# Patient Record
Sex: Female | Born: 1980 | Race: White | Hispanic: No | Marital: Married | Smoking: Former smoker
Health system: Southern US, Community
[De-identification: ages and names within clinical notes are randomized; demographics above are authoritative.]

## PROBLEM LIST (undated history)

## (undated) DIAGNOSIS — F32A Depression, unspecified: Secondary | ICD-10-CM

## (undated) DIAGNOSIS — K219 Gastro-esophageal reflux disease without esophagitis: Secondary | ICD-10-CM

## (undated) DIAGNOSIS — F329 Major depressive disorder, single episode, unspecified: Secondary | ICD-10-CM

## (undated) DIAGNOSIS — F419 Anxiety disorder, unspecified: Secondary | ICD-10-CM

## (undated) DIAGNOSIS — Z8619 Personal history of other infectious and parasitic diseases: Secondary | ICD-10-CM

## (undated) HISTORY — DX: Depression, unspecified: F32.A

## (undated) HISTORY — DX: Gastro-esophageal reflux disease without esophagitis: K21.9

## (undated) HISTORY — DX: Anxiety disorder, unspecified: F41.9

## (undated) HISTORY — DX: Major depressive disorder, single episode, unspecified: F32.9

## (undated) HISTORY — DX: Personal history of other infectious and parasitic diseases: Z86.19

## (undated) HISTORY — PX: APPENDECTOMY: SHX54

---
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
## 2003-11-20 ENCOUNTER — Other Ambulatory Visit: Admission: RE | Admit: 2003-11-20 | Discharge: 2003-11-20 | Payer: Self-pay | Admitting: Obstetrics and Gynecology

## 2004-05-31 ENCOUNTER — Encounter (INDEPENDENT_AMBULATORY_CARE_PROVIDER_SITE_OTHER): Payer: Self-pay | Admitting: *Deleted

## 2004-05-31 ENCOUNTER — Observation Stay (HOSPITAL_COMMUNITY): Admission: EM | Admit: 2004-05-31 | Discharge: 2004-05-31 | Payer: Self-pay | Admitting: Emergency Medicine

## 2005-06-21 ENCOUNTER — Other Ambulatory Visit: Admission: RE | Admit: 2005-06-21 | Discharge: 2005-06-21 | Payer: Self-pay | Admitting: Obstetrics and Gynecology

## 2006-03-14 ENCOUNTER — Emergency Department (HOSPITAL_COMMUNITY): Admission: EM | Admit: 2006-03-14 | Discharge: 2006-03-14 | Payer: Self-pay | Admitting: Emergency Medicine

## 2006-04-02 ENCOUNTER — Other Ambulatory Visit: Admission: RE | Admit: 2006-04-02 | Discharge: 2006-04-02 | Payer: Self-pay | Admitting: Obstetrics and Gynecology

## 2007-04-13 ENCOUNTER — Emergency Department (HOSPITAL_COMMUNITY): Admission: EM | Admit: 2007-04-13 | Discharge: 2007-04-13 | Payer: Self-pay | Admitting: Emergency Medicine

## 2007-11-24 ENCOUNTER — Inpatient Hospital Stay (HOSPITAL_COMMUNITY): Admission: AD | Admit: 2007-11-24 | Discharge: 2007-11-24 | Payer: Self-pay | Admitting: Gynecology

## 2010-06-22 ENCOUNTER — Encounter: Admission: RE | Admit: 2010-06-22 | Discharge: 2010-06-22 | Payer: Self-pay | Admitting: Family Medicine

## 2010-12-02 ENCOUNTER — Other Ambulatory Visit: Payer: Self-pay | Admitting: Obstetrics and Gynecology

## 2010-12-02 DIAGNOSIS — R599 Enlarged lymph nodes, unspecified: Secondary | ICD-10-CM

## 2010-12-09 ENCOUNTER — Other Ambulatory Visit: Payer: Self-pay

## 2010-12-09 NOTE — Op Note (Signed)
[name], [name]              ACCOUNT NO.:  [id number]   MEDICAL RECORD NO.:  [id number]          PATIENT TYPE:  INP   LOCATION:  0111                         FACILITY:  [facility]   PHYSICIAN:  Leonie Man, M.D.   DATE OF BIRTH:  01-Apr-1981   DATE OF PROCEDURE:  05/31/2004  DATE OF DISCHARGE:                                 OPERATIVE REPORT   PREOPERATIVE DIAGNOSIS:  Acute appendicitis.   POSTOPERATIVE DIAGNOSIS:  Acute appendicitis.   PROCEDURE:  Laparoscopic appendectomy.   SURGEON:  Leonie Man, M.D.   ASSISTANT:  Nurse.   ANESTHESIA:  General.   NOTE:  The patient is a 30 year old woman who began having pain in the lower  abdomen on May 30, 2004.  This pain progressed and was associated with  nausea and vomiting, and in the ER she was noted to have a temperature of 99  and a white blood cell count of 16,000 with a left shift.  CT scan of the  abdomen was consistent with appendicitis.  She comes to the operating room  now after risks and potential benefits of surgery have been fully discussed  with her and with her mother, and they give consent to surgery.   The patient is allergic to PENICILLIN and was premedicated with Cleocin and  gentamicin.   PROCEDURE:  Following the induction of satisfactory general anesthesia with  the patient positioned supinely, the abdomen is prepped and draped to be  included in the sterile operative field.  Open laparoscopy is created at the  umbilicus with an insertion of Hasson cannula.  It is placed in the  peritoneal cavity to 14 mmHg pressure.  The camera was inserted.  Visual  exploration showed a suppurative appendicitis.  The remainder of the small  and large intestine and all other parts of the viscera viewed appeared to be  normal.  Under direct vision a left lower quadrant trocar site was placed,  also a suprapubic trocar site.  The appendix was grasped and the  mesoappendix divided and using an Endo-GIA, the appendix  was transected at  its base.  There was no bleeding.  The mesoappendix was transected with the  electrocautery and there was no bleeding.  All areas of dissection were then  checked for hemostasis and noted to be dry.  The right lower quadrant was  then thoroughly irrigated with multiple aliquots of normal saline and  aspirated.  The appendix was placed in an Endopouch and retrieved through  the umbilical wound.  The sponge and instrument counts were verified,  trocars removed, and the wound closed in layers as follows:  The umbilical  wound in two layers with 0 Vicryl and 4-0 Monocryl, the suprapubic and left  lower quadrant wounds  closed with 4-0 Monocryl sutures.  The wounds were then reinforced with  Dermabond, the anesthetic reversed, and the patient removed from the  operating room to the recovery room in stable condition.  She tolerated the  procedure well.     Patr   PB/MEDQ  D:  05/31/2004  T:  05/31/2004  Job:  [id number]

## 2010-12-09 NOTE — H&P (Signed)
Bianca Harrington, BERKE NO.:  [id number]   MEDICAL RECORD NO.:  [id number]          PATIENT TYPE:  EMS   LOCATION:  ED                           FACILITY:  [facility]   PHYSICIAN:  Leonie Man, M.D.   DATE OF BIRTH:  [birth year]   DATE OF ADMISSION:  05/30/2004  DATE OF DISCHARGE:                                HISTORY & PHYSICAL   PROBLEM:  Abdominal pain starting at 2:20 p.m. on May 30, 2004  associated with nausea and vomiting in this 30 year old female. She did eat  some cereal this past p.m. but because of her increasing lower abdominal  pain more on the right than on the left, she was sent to the ER and noted to  have a temperature of 99 with a white count of 16,000. Hemoglobin 14,  platelets 284,000.  She had a CT scan of the abdomen which was consistent  with acute appendicitis, also noted was what seemed to be follicular cyst of  the left ovary.   PAST MEDICAL HISTORY:  The patient has had no previous surgical operations.  She takes no medications chronically.   ALLERGIES:  PENICILLIN.   SOCIAL HISTORY:  This 30 year old works for [employer]  currently but  plans to return to school in January.  She was recently off birth control  patch for the past month. She did not have any menses in October.  Her urine  pregnancy test today is negative. The patient does not smoke or use alcohol.   REVIEW OF SYSTEMS:  Negative in detail except as outlined above.   PHYSICAL EXAMINATION:  GENERAL:  She is a well-developed, well-nourished  female.  VITAL SIGNS:  Her blood pressure is 116/72, pulse is 69 and regular,  respirations 16 and temperature is 99.2.  HEENT:  Sclerae are anicteric. Pupils are equal. No nasal obstruction.  Oropharynx is benign.  NECK:  No thyromegaly, no adenopathy.  LUNGS:  Clear to auscultation bilaterally.  HEART:  Regular rate and rhythm without murmurs.  ABDOMEN:  Tender in the right lower quadrant great than in the left lower  quadrant but she does have rebound in the right lower quadrant.  No masses,  no visceromegaly.  EXTREMITIES:  The patient moves all 4 extremities well. There is no calf  tenderness or ankle edema.   ASSESSMENT:  Acute appendicitis.   PLAN:  Appendectomy. Will probable be able to do this laparoscopically but  may need to do an open appendectomy.  I discussed the risks and potential  benefits of surgery with the patient and her mother who is present with her  in the room. The patient is somewhat sleepy and sedated although her last  dose of narcotics was more than 4 hours ago. They indicate that they  understand these risks and benefits and give consent to surgery.     Patr  PB/MEDQ  D:  05/31/2004  T:  05/31/2004  Job:  [id number]

## 2010-12-12 ENCOUNTER — Ambulatory Visit
Admission: RE | Admit: 2010-12-12 | Discharge: 2010-12-12 | Disposition: A | Discharge status: Home / Self Care | Payer: BC Managed Care – PPO | Source: Ambulatory Visit | Attending: Obstetrics and Gynecology | Admitting: Obstetrics and Gynecology

## 2010-12-12 DIAGNOSIS — R599 Enlarged lymph nodes, unspecified: Secondary | ICD-10-CM

## 2011-01-04 ENCOUNTER — Other Ambulatory Visit (HOSPITAL_COMMUNITY): Payer: Self-pay | Admitting: Obstetrics and Gynecology

## 2011-01-04 DIAGNOSIS — O3680X0 Pregnancy with inconclusive fetal viability, not applicable or unspecified: Secondary | ICD-10-CM

## 2011-01-05 ENCOUNTER — Ambulatory Visit (HOSPITAL_COMMUNITY): Payer: BC Managed Care – PPO

## 2011-01-05 ENCOUNTER — Ambulatory Visit (HOSPITAL_COMMUNITY)
Admission: RE | Admit: 2011-01-05 | Discharge: 2011-01-05 | Disposition: A | Discharge status: Home / Self Care | Payer: BC Managed Care – PPO | Source: Ambulatory Visit | Attending: Obstetrics and Gynecology | Admitting: Obstetrics and Gynecology

## 2011-01-05 DIAGNOSIS — O3680X0 Pregnancy with inconclusive fetal viability, not applicable or unspecified: Secondary | ICD-10-CM

## 2011-01-05 DIAGNOSIS — Z3689 Encounter for other specified antenatal screening: Secondary | ICD-10-CM | POA: Insufficient documentation

## 2011-01-05 DIAGNOSIS — O209 Hemorrhage in early pregnancy, unspecified: Secondary | ICD-10-CM | POA: Insufficient documentation

## 2011-01-07 ENCOUNTER — Inpatient Hospital Stay (HOSPITAL_COMMUNITY): Payer: BC Managed Care – PPO

## 2011-01-07 ENCOUNTER — Inpatient Hospital Stay (HOSPITAL_COMMUNITY)
Admission: AD | Admit: 2011-01-07 | Discharge: 2011-01-07 | Disposition: A | Discharge status: Home / Self Care | Payer: BC Managed Care – PPO | Source: Ambulatory Visit | Attending: Obstetrics and Gynecology | Admitting: Obstetrics and Gynecology

## 2011-01-07 DIAGNOSIS — O200 Threatened abortion: Secondary | ICD-10-CM | POA: Insufficient documentation

## 2011-01-07 LAB — HCG, QUANTITATIVE, PREGNANCY: hCG, Beta Chain, Quant, S: 3387 m[IU]/mL — ABNORMAL HIGH (ref <5)

## 2011-01-07 LAB — ABO/RH: ABO/RH(D): O POS

## 2011-08-08 ENCOUNTER — Ambulatory Visit (INDEPENDENT_AMBULATORY_CARE_PROVIDER_SITE_OTHER): Payer: BC Managed Care – PPO

## 2011-08-08 DIAGNOSIS — G4700 Insomnia, unspecified: Secondary | ICD-10-CM

## 2011-08-08 DIAGNOSIS — F988 Other specified behavioral and emotional disorders with onset usually occurring in childhood and adolescence: Secondary | ICD-10-CM

## 2011-08-08 DIAGNOSIS — R509 Fever, unspecified: Secondary | ICD-10-CM

## 2011-08-08 DIAGNOSIS — F411 Generalized anxiety disorder: Secondary | ICD-10-CM

## 2011-11-14 IMAGING — US US OB TRANSVAGINAL
1 series · 14 of 22 positions shown · non-contrast
Comparison: Ultrasound 01/05/2011.

CLINICAL DATA: Vaginal bleeding.

TRANSVAGINAL OB ULTRASOUND
TECHNIQUE: Transvaginal ultrasound was performed for evaluation of
the gestation as well as the maternal uterus and adnexal regions.

[Series 1: us ob comp less 14 wks · 22 acquisitions, 14 frames shown]
[im 1/22]
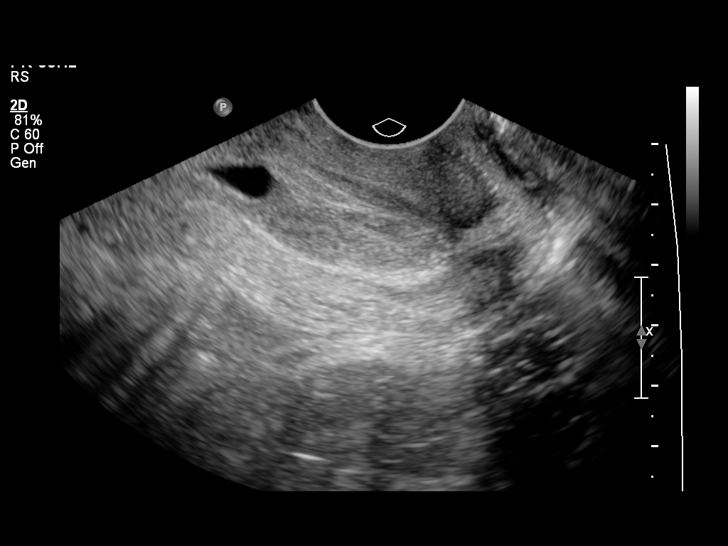
[im 3/22]
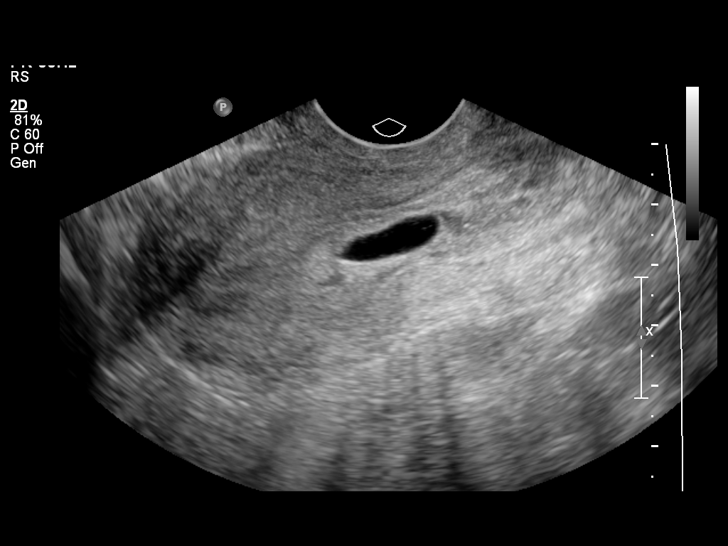
[im 4/22]
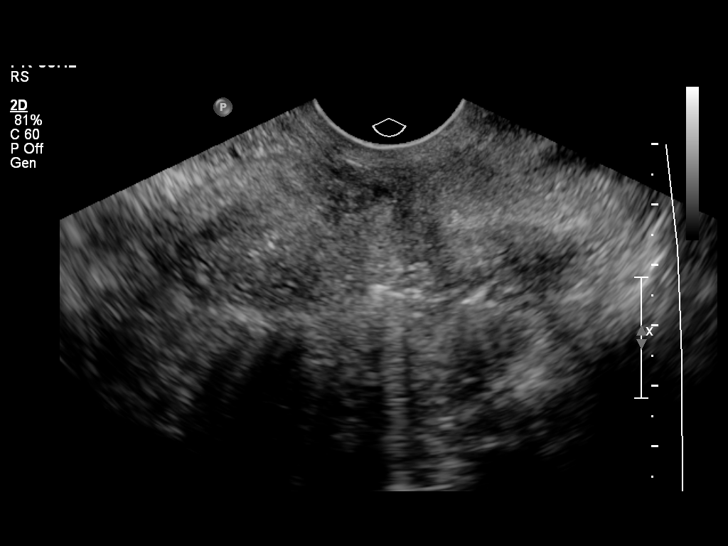
[im 6/22]
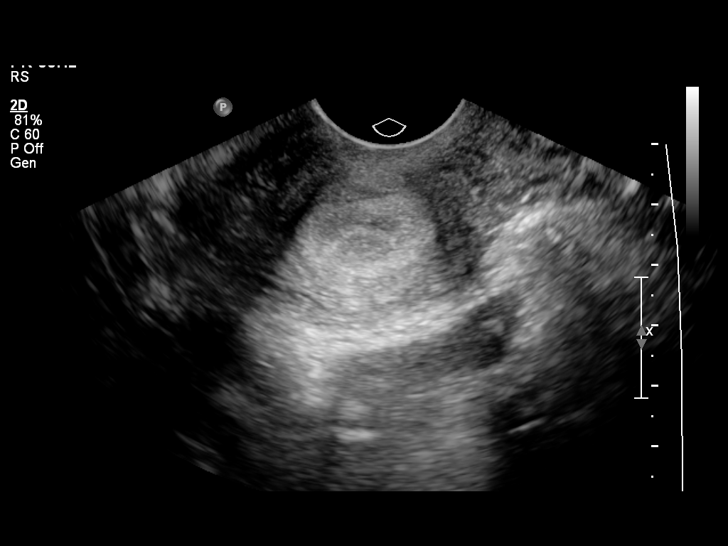
[im 8/22]
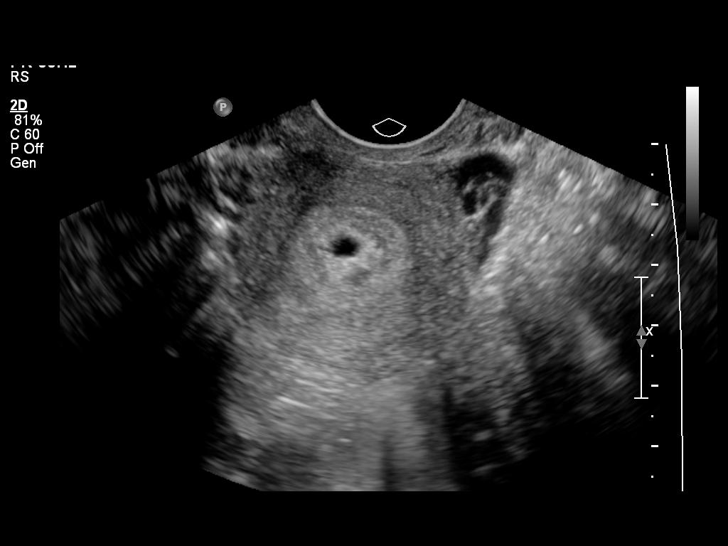
[im 9/22]
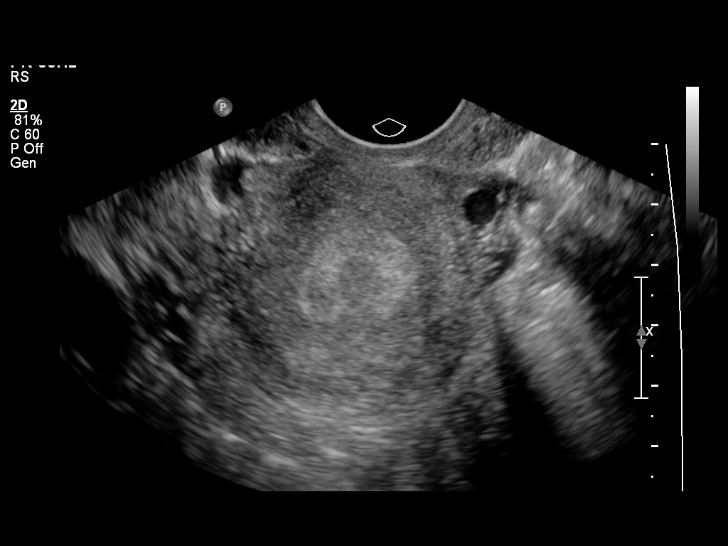
[im 11/22]
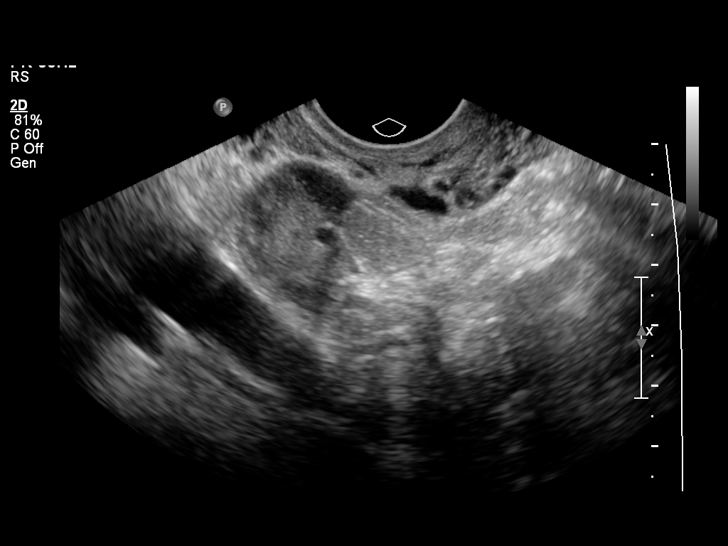
[im 12/22]
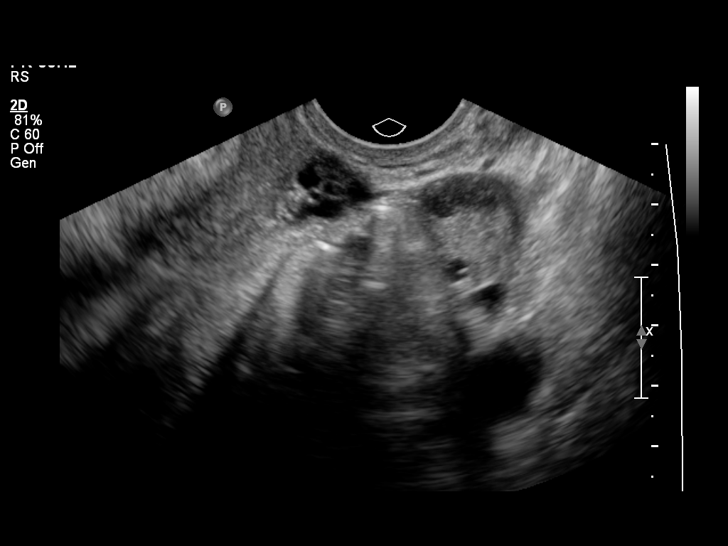
[im 14/22]
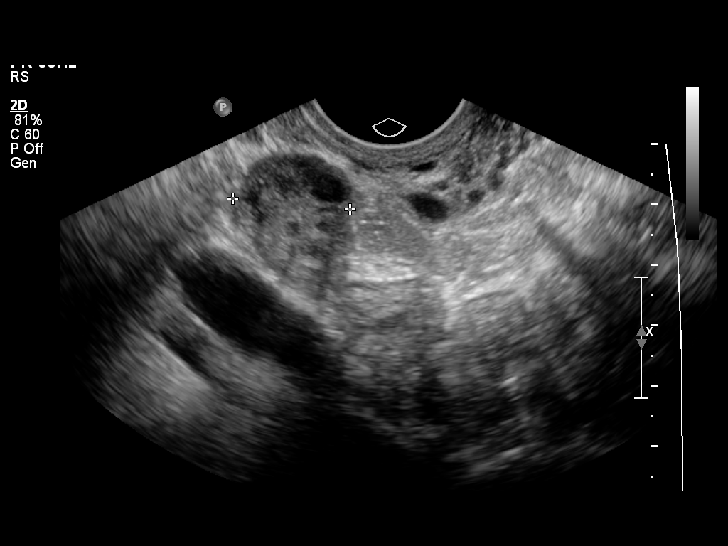
[im 15/22]
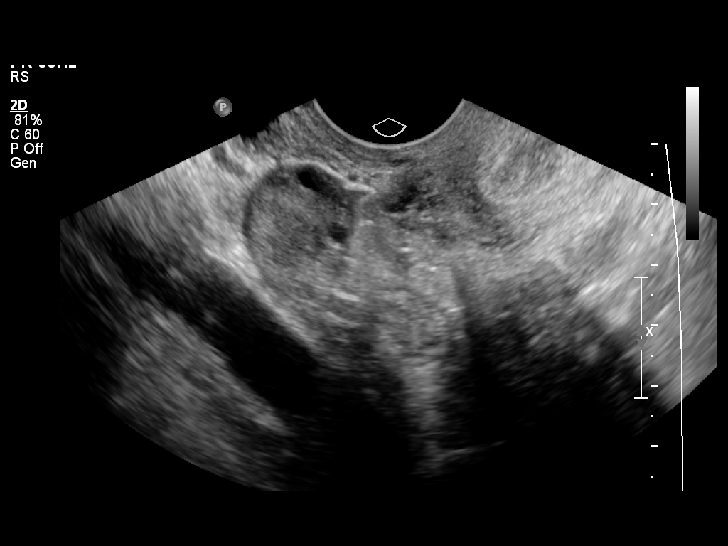
[im 17/22]
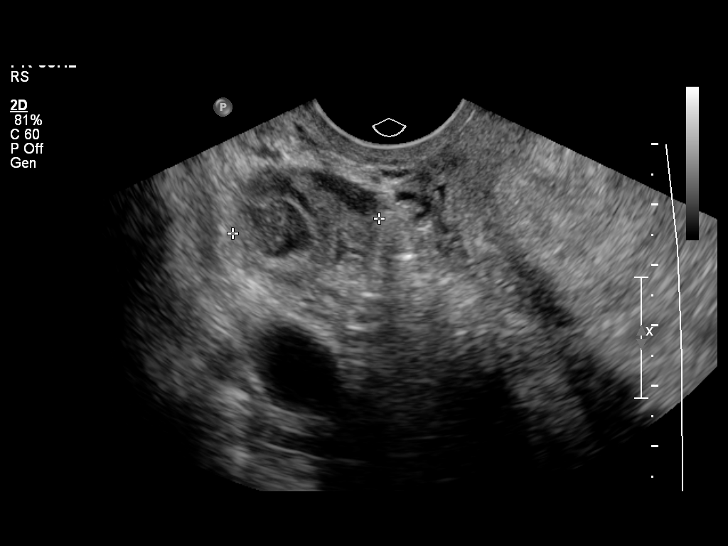
[im 19/22]
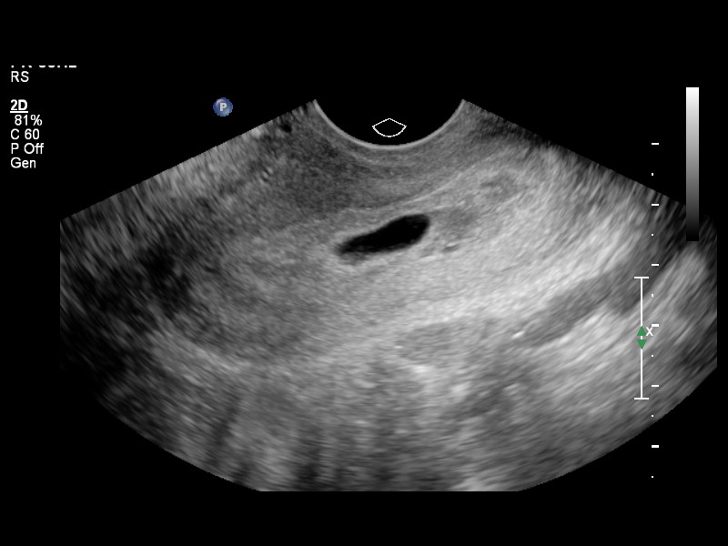
[im 20/22]
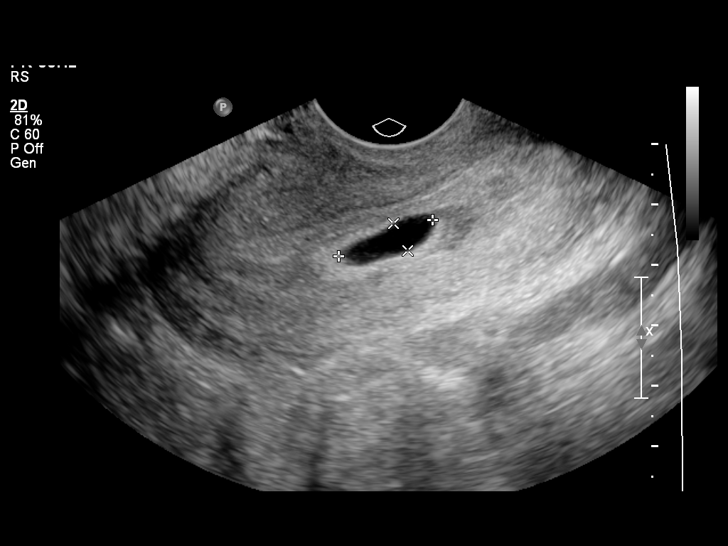
[im 22/22]
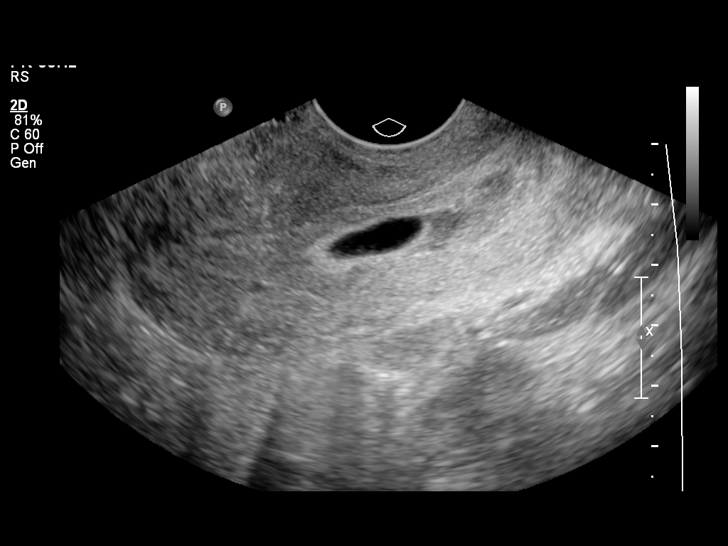

[14 of 22 positions shown; findings below may reference images not displayed]

FINDINGS: There is a single intrauterine gestational sac estimated
at 5 weeks and 5 days gestation.  Decidual reaction is noted.  The
gestational sac is somewhat elongated. The gestational sac is
located in the midbody lower uterine segment junction region.

No embryonic pole or yolk sac is identified.

No subchorionic hemorrhage.
Both ovaries appear normal.
IMPRESSION: 1.  Intrauterine gestational sac estimated 5 weeks and 5 days
gestation.  No yolk sac or embryonic pole. Recommend continued
clinical and sonographic follow-up.
2.  No subchorionic hemorrhage.
3.  Normal ovaries.

## 2011-11-29 ENCOUNTER — Ambulatory Visit: Payer: BC Managed Care – PPO

## 2011-11-29 ENCOUNTER — Ambulatory Visit (INDEPENDENT_AMBULATORY_CARE_PROVIDER_SITE_OTHER): Payer: BC Managed Care – PPO | Admitting: Internal Medicine

## 2011-11-29 VITALS — BP 110/64 | HR 79 | Temp 98.3°F | Resp 16 | Ht 66.75 in | Wt 123.4 lb

## 2011-11-29 DIAGNOSIS — F411 Generalized anxiety disorder: Secondary | ICD-10-CM

## 2011-11-29 DIAGNOSIS — R5381 Other malaise: Secondary | ICD-10-CM

## 2011-11-29 DIAGNOSIS — R531 Weakness: Secondary | ICD-10-CM

## 2011-11-29 DIAGNOSIS — J40 Bronchitis, not specified as acute or chronic: Secondary | ICD-10-CM

## 2011-11-29 DIAGNOSIS — R42 Dizziness and giddiness: Secondary | ICD-10-CM

## 2011-11-29 DIAGNOSIS — R55 Syncope and collapse: Secondary | ICD-10-CM

## 2011-11-29 LAB — POCT CBC
HCT, POC: 44.1 % (ref 37.7–47.9)
Hemoglobin: 14.1 g/dL (ref 12.2–16.2)
MPV: 9.2 fL (ref 0–99.8)
POC Granulocyte: 6.9 (ref 2–6.9)
POC MID %: 6.7 %M (ref 0–12)
RBC: 4.92 M/uL (ref 4.04–5.48)

## 2011-11-29 LAB — POCT URINE PREGNANCY: Preg Test, Ur: NEGATIVE

## 2011-11-29 MED ORDER — SULFAMETHOXAZOLE-TRIMETHOPRIM 800-160 MG PO TABS: 1.0000 | ORAL_TABLET | Freq: Two times a day (BID) | ORAL | Status: AC

## 2011-11-29 MED ORDER — SERTRALINE HCL 50 MG PO TABS: 50.0000 mg | ORAL_TABLET | Freq: Every day | ORAL | Status: DC

## 2011-11-29 NOTE — Patient Instructions (Signed)
Therapist mickey dew. zoloft 1 tab daily. Bactrim 1 tab 2 times dialy for 10 days. Return to follow up as directed.

## 2011-11-29 NOTE — Progress Notes (Signed)
Subjective:    Patient ID: Bianca Harrington, female    DOB: [birth year], 31 y.o.   MRN: [id number]  HPI Dizz,y weak, hot and cold,symptoms occur more after she eats. Missed lunch today. No weight loss No fevers Symptoms have been going on for 6 months.  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 Also picks at her skin  Review of Systems  Unable to perform ROS Constitutional: Positive for chills.  HENT: Negative.   Eyes: Negative.   Respiratory: Negative.   Cardiovascular: Negative.   Gastrointestinal: Negative.   Genitourinary: Negative.   Musculoskeletal: Negative.   Skin: Negative.        When she is anxious she picks on the skin on her back  Neurological: Negative.   Hematological: Negative.   Psychiatric/Behavioral: Positive for sleep disturbance and agitation. Negative for suicidal ideas, hallucinations, behavioral problems, confusion, self-injury, dysphoric mood and decreased concentration. The patient is nervous/anxious. The patient is not hyperactive.   All other systems reviewed and are negative.       Objective:   Physical Exam  Nursing note and vitals reviewed. Constitutional: She is oriented to person, place, and time. She appears well-developed and well-nourished.  HENT:  Head: Normocephalic and atraumatic.  Right Ear: External ear normal.  Left Ear: External ear normal.  Nose: Nose normal.  Eyes: Conjunctivae and EOM are normal. Pupils are equal, round, and reactive to light.  Neck: Normal range of motion. Neck supple.  Cardiovascular: Normal rate, regular rhythm, normal heart sounds and intact distal pulses.   Pulmonary/Chest: Effort normal and breath sounds normal.  Abdominal: Soft. Bowel sounds are normal.  Musculoskeletal: Normal range of motion.  Neurological: She is alert and oriented to person,  place, and time. She has normal reflexes.  Skin: Skin is warm and dry.       Picked acne on her back has several Band-Aid on it  Psychiatric: Her behavior is normal. Judgment and thought content normal.       Anxious no suicidal no homicidal Feels safe in her home environment    Results for orders placed in visit on 11/29/11  POCT URINE PREGNANCY      Component Value Range   Preg Test, Ur Negative    GLUCOSE, POCT (MANUAL RESULT ENTRY)      Component Value Range   POC Glucose 76    POCT CBC      Component Value Range   WBC 11.3 (*) 4.6 - 10.2 (K/uL)   Lymph, poc 3.6 (*) 0.6 - 3.4    POC LYMPH PERCENT 32.1  10 - 50 (%L)   MID (cbc) 0.8  0 - 0.9    POC MID % 6.7  0 - 12 (%M)   POC Granulocyte 6.9  2 - 6.9    Granulocyte percent 61.2  37 - 80 (%G)   RBC 4.92  4.04 - 5.48 (M/uL)   Hemoglobin 14.1  12.2 - 16.2 (g/dL)   HCT, POC 40.9  81.1 - 47.9 (%)   MCV 89.7  80 - 97 (fL)   MCH, POC 28.7  27 - 31.2 (pg)   MCHC 32.0  31.8 - 35.4 (g/dL)   RDW, POC 91.4     Platelet Count, POC 364  142 - 424 (K/uL)   MPV 9.2  0 - 99.8 (fL)   [facility] reading (  PRIMARY) by  Dr. Mindi Harrington .increased bronchial markings o nthe right consistent with bronchitus.     ekg nsr hr 55 no acute chnages Assessment & Plan:  Increased wbc and sob with increased markings on the right on chest xray. Will rx with antibiotics. Also recommend restarting her antidepressant does not want ot start celexa again will suggest zocloft. And re establish relationship with therapist.

## 2011-12-26 ENCOUNTER — Other Ambulatory Visit: Payer: Self-pay | Admitting: Dermatology

## 2012-01-30 ENCOUNTER — Other Ambulatory Visit: Payer: Self-pay | Admitting: Internal Medicine

## 2012-02-01 ENCOUNTER — Telehealth: Payer: Self-pay | Admitting: Internal Medicine

## 2012-02-02 ENCOUNTER — Other Ambulatory Visit: Payer: Self-pay | Admitting: *Deleted

## 2012-02-02 NOTE — Telephone Encounter (Signed)
i can't find request for rx-where is it?

## 2012-02-02 NOTE — Telephone Encounter (Signed)
I will help you when you get to work today.

## 2012-04-25 ENCOUNTER — Other Ambulatory Visit: Payer: Self-pay | Admitting: Internal Medicine

## 2012-04-25 ENCOUNTER — Other Ambulatory Visit: Payer: Self-pay | Admitting: Physician Assistant

## 2012-05-29 ENCOUNTER — Other Ambulatory Visit: Payer: Self-pay | Admitting: Physician Assistant

## 2012-05-30 ENCOUNTER — Other Ambulatory Visit: Payer: Self-pay | Admitting: Radiology

## 2012-09-02 ENCOUNTER — Other Ambulatory Visit: Payer: Self-pay | Admitting: Physician Assistant

## 2012-09-02 NOTE — Telephone Encounter (Signed)
Needs OV.  

## 2012-09-23 ENCOUNTER — Other Ambulatory Visit: Payer: Self-pay | Admitting: Physician Assistant

## 2012-10-03 ENCOUNTER — Other Ambulatory Visit: Payer: Self-pay | Admitting: Physician Assistant

## 2012-10-04 ENCOUNTER — Other Ambulatory Visit: Payer: Self-pay | Admitting: Radiology

## 2012-10-04 NOTE — Telephone Encounter (Signed)
Rx faxed to Pharmacy.

## 2012-10-21 ENCOUNTER — Ambulatory Visit: Payer: Self-pay | Admitting: Internal Medicine

## 2012-10-21 VITALS — BP 102/52 | HR 61 | Temp 98.3°F | Resp 16 | Ht 67.0 in | Wt 129.6 lb

## 2012-10-21 DIAGNOSIS — G4700 Insomnia, unspecified: Secondary | ICD-10-CM

## 2012-10-21 DIAGNOSIS — F411 Generalized anxiety disorder: Secondary | ICD-10-CM

## 2012-10-21 DIAGNOSIS — F429 Obsessive-compulsive disorder, unspecified: Secondary | ICD-10-CM

## 2012-10-21 MED ORDER — SERTRALINE HCL 100 MG PO TABS: 100.0000 mg | ORAL_TABLET | Freq: Every day | ORAL | Status: DC

## 2012-10-21 MED ORDER — ALPRAZOLAM 0.5 MG PO TABS: 0.5000 mg | ORAL_TABLET | Freq: Three times a day (TID) | ORAL | Status: DC | PRN

## 2012-10-21 NOTE — Progress Notes (Signed)
  Subjective:    Patient ID: Bianca Harrington, female    DOB: 12-Nov-1980, 32 y.o.   MRN: [id number]  HPI here for refill of medications for anxiety disorder with mild depression 30 1/30year-old twins Body image disorder with controlled bleeding and overexercising in high school and early college At last visit with Dr. 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 sertraline (ZOLOFT) 100 MG tablet    Sig: Take 1 tablet (100 mg total) by mouth daily.    Dispense:  30 tablet    Refill:  5  . ALPRAZolam (XANAX) 0.5 MG tablet    Sig: Take 1 tablet (0.5 mg total) by mouth 3 (three) times daily as needed for anxiety. Needs office visit for further refills.    Dispense:  90 tablet    Refill:  1   Appointment 1043 months

## 2012-10-23 NOTE — Progress Notes (Signed)
4/2 Left msg for pt to schedule June f-up with Dr. Merla Riches.

## 2012-10-25 NOTE — Progress Notes (Signed)
10/25/12 Sent pt reminder letter to schedule June f-up with Dr. Merla Riches.

## 2013-01-08 ENCOUNTER — Ambulatory Visit (INDEPENDENT_AMBULATORY_CARE_PROVIDER_SITE_OTHER): Payer: BC Managed Care – PPO | Admitting: Internal Medicine

## 2013-01-08 VITALS — BP 109/74 | HR 75 | Temp 99.3°F | Resp 16 | Ht 67.0 in | Wt 128.0 lb

## 2013-01-08 DIAGNOSIS — F429 Obsessive-compulsive disorder, unspecified: Secondary | ICD-10-CM

## 2013-01-08 MED ORDER — SERTRALINE HCL 100 MG PO TABS: 100.0000 mg | ORAL_TABLET | Freq: Every day | ORAL | Status: DC

## 2013-01-08 MED ORDER — ALPRAZOLAM 0.5 MG PO TABS: 0.5000 mg | ORAL_TABLET | Freq: Three times a day (TID) | ORAL | Status: DC | PRN

## 2013-01-09 NOTE — Progress Notes (Signed)
  Subjective:    Patient ID: Bianca Harrington, female    DOB: [birth year], 32 y.o.   MRN: [id number]  HPIhere for f/u Patient Active Problem List   Diagnosis Date Noted  . OCD (obsessive compulsive disorder) 10/21/2012  doing very well since last ov Sleeping well-espec w/ alpraz hs Enjoys the kids being out of school Currently w/ GYN ?cramping and chg in periods --    Review of Systems noncontr Ros neg for sxt in other areas besides pelvic    Objective:   Physical Exam BP 109/74  Pulse 75  Temp(Src) 99.3 F (37.4 C)  Resp 16  Ht 5\' 7"  (1.702 m)  Wt 128 lb (58.06 kg)  BMI 20.04 kg/m2 HEENT clear Lungs clear No cva tend to perc Skin clear      Assessment & Plan:  OCD (obsessive compulsive disorder)  Meds ordered this encounter  Medications  . sertraline (ZOLOFT) 100 MG tablet    Sig: Take 1 tablet (100 mg total) by mouth daily.    Dispense:  90 tablet    Refill:  3  . ALPRAZolam (XANAX) 0.5 MG tablet    Sig: Take 1 tablet (0.5 mg total) by mouth 3 (three) times daily as needed for anxiety. Needs office visit for further refills.    Dispense:  90 tablet    Refill:  1   F/u as needed If decides to have another will d/c alpraz and disc zoloft w/ OB

## 2013-04-09 ENCOUNTER — Encounter: Payer: Self-pay | Admitting: Internal Medicine

## 2013-04-09 ENCOUNTER — Ambulatory Visit (INDEPENDENT_AMBULATORY_CARE_PROVIDER_SITE_OTHER): Payer: BC Managed Care – PPO | Admitting: Internal Medicine

## 2013-04-09 VITALS — BP 98/60 | HR 73 | Temp 98.9°F | Resp 16 | Ht 66.5 in | Wt 129.5 lb

## 2013-04-09 DIAGNOSIS — F429 Obsessive-compulsive disorder, unspecified: Secondary | ICD-10-CM

## 2013-04-09 DIAGNOSIS — Z23 Encounter for immunization: Secondary | ICD-10-CM

## 2013-04-09 MED ORDER — ALPRAZOLAM 0.5 MG PO TABS: 0.5000 mg | ORAL_TABLET | Freq: Three times a day (TID) | ORAL | Status: DC | PRN

## 2013-04-09 MED ORDER — BUPROPION HCL ER (XL) 150 MG PO TB24: ORAL_TABLET | ORAL | Status: DC

## 2013-04-09 MED ORDER — SERTRALINE HCL 25 MG PO TABS: ORAL_TABLET | ORAL | Status: DC

## 2013-04-09 MED ORDER — BUPROPION HCL ER (XL) 300 MG PO TB24: 300.0000 mg | ORAL_TABLET | Freq: Every day | ORAL | Status: DC

## 2013-04-09 NOTE — Progress Notes (Addendum)
  Subjective:    Patient ID: Bianca Harrington, female    DOB: [birth year], 32 y.o.   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 OCD--- changed to Wellbutrin  Insomnia secondary to anxiety-alprazolam at bedtime very effective  Read OAD- ?organ/?cbt Meds ordered this encounter  Medications  . sertraline (ZOLOFT) 25 MG tablet    Sig: 2 a day for 10 days then one a day for 7-10 days  weaning off     Dispense:  90 tablet    Refill:  3  . buPROPion (WELLBUTRIN XL) 150 MG 24 hr tablet    Sig: One a day for 10 days and then 2 a day    Dispense:  50 tablet    Refill:  0  . buPROPion (WELLBUTRIN XL) 300 MG 24 hr tablet----to fill in one month     Sig: Take 1 tablet (300 mg total) by mouth daily.    Dispense:  30 tablet    Refill:  2   . ALPRAZolam (XANAX) 0.5 MG tablet    Sig: Take 1 tablet (0.5 mg total) by mouth 3 (three) times daily as needed for anxiety.    Dispense:  90 tablet    Refill:  1       she now only uses this at bedtime and is very effective    Recheck as needed

## 2013-04-09 NOTE — Patient Instructions (Addendum)
Overcoming anxiety for dummies

## 2013-05-19 ENCOUNTER — Ambulatory Visit: Payer: BC Managed Care – PPO | Admitting: Family Medicine

## 2013-05-19 VITALS — BP 110/70 | HR 78 | Temp 98.8°F | Resp 16 | Ht 67.0 in | Wt 126.0 lb

## 2013-05-19 DIAGNOSIS — F411 Generalized anxiety disorder: Secondary | ICD-10-CM

## 2013-05-19 DIAGNOSIS — R599 Enlarged lymph nodes, unspecified: Secondary | ICD-10-CM

## 2013-05-19 DIAGNOSIS — T50905A Adverse effect of unspecified drugs, medicaments and biological substances, initial encounter: Secondary | ICD-10-CM

## 2013-05-19 DIAGNOSIS — R591 Generalized enlarged lymph nodes: Secondary | ICD-10-CM

## 2013-05-19 DIAGNOSIS — J069 Acute upper respiratory infection, unspecified: Secondary | ICD-10-CM

## 2013-05-19 DIAGNOSIS — T887XXA Unspecified adverse effect of drug or medicament, initial encounter: Secondary | ICD-10-CM

## 2013-05-19 MED ORDER — SERTRALINE HCL 25 MG PO TABS: 12.5000 mg | ORAL_TABLET | Freq: Every day | ORAL | Status: DC

## 2013-05-19 MED ORDER — DOXYCYCLINE HYCLATE 100 MG PO TABS: 100.0000 mg | ORAL_TABLET | Freq: Two times a day (BID) | ORAL | Status: DC

## 2013-05-19 NOTE — Progress Notes (Signed)
  Subjective:    Patient ID: Gus Height, female    DOB: [birth year], 32 y.o.   MRN: [id number]  HPI    Review of Systems     Objective:   Physical Exam HEENT: unremarkable except for narrowed nasla passages Chest:  Clear Heart:  Reg Neck:  Barely palpable posterior right node, nonfixed Axilla:  Movable normal sized nodes on right supraclavicluar area:  No adenopathy Skin:  normal     Assessment & Plan:  Anxiety state, unspecified - Plan: sertraline (ZOLOFT) 25 MG tablet  URI, acute - Plan: doxycycline (VIBRA-TABS) 100 MG tablet  Lymphadenopathy - Plan: doxycycline (VIBRA-TABS) 100 MG tablet  Adverse reaction to drug, initial encounter  Signed, Elvina Sidle, MD

## 2013-07-02 ENCOUNTER — Encounter: Payer: Self-pay | Admitting: Internal Medicine

## 2013-07-02 ENCOUNTER — Ambulatory Visit: Payer: BC Managed Care – PPO | Admitting: Internal Medicine

## 2013-07-02 VITALS — BP 112/64 | HR 74 | Temp 99.6°F | Resp 16 | Ht 67.0 in | Wt 127.0 lb

## 2013-07-02 DIAGNOSIS — R591 Generalized enlarged lymph nodes: Secondary | ICD-10-CM

## 2013-07-02 DIAGNOSIS — G4700 Insomnia, unspecified: Secondary | ICD-10-CM

## 2013-07-02 DIAGNOSIS — J069 Acute upper respiratory infection, unspecified: Secondary | ICD-10-CM

## 2013-07-02 DIAGNOSIS — F429 Obsessive-compulsive disorder, unspecified: Secondary | ICD-10-CM

## 2013-07-02 NOTE — Progress Notes (Signed)
   Subjective:    Patient ID: Bianca Harrington, female    DOB: 1980-12-20, 32 y.o.   MRN: [id number]  HPI  Patient reports right lymph node has decreased in size. Noticed in the spring. Was treated with doxy 10/14. Also has right axillary lymph nodes that have been stable for 4 years. "Obsessed." Concerned with smoking history. Stopped smoking in October.  More alert during the day with Wellbutrin. Occasionally needs Xanax. Sleeping better at night. Continues to obsess with cleanliness, but other obsessive thoughts not bothering her.  No medication side effects. Had some side effects with abrupt stopping of zoloft. Restarted zoloft and weaned slowly with improvement of side effects. Does not want to be on medicine forever, yet is concerned about not being able to control obsessive thoughts. One of big concerns is weight. Also concerned about adjusting when her twins go to kindergarten.   Mother has moved back to [place].   Review of Systems     Objective:   Physical Exam  Nursing note and vitals reviewed. Constitutional: She is oriented to person, place, and time. She appears well-developed and well-nourished.  Neck: Normal range of motion. Neck supple.  0.5 cm mobile, node at edge of right trapezius.  Pulmonary/Chest: Effort normal.  Lymphadenopathy:    She has cervical adenopathy.  Neurological: She is alert and oriented to person, place, and time.  Psychiatric: She has a normal mood and affect. Her behavior is normal. Judgment and thought content normal.      Assessment & Plan:  OCD (obsessive compulsive disorder)  continue bupropion 300 mg po qd; xanax for sleep/anxiety PRN  Follow up in 6 months  Lymphadenopathy-minor-reactive  Continue to monitor for increased size or pain.     I have completed the patient encounter in its entirety as documented by FNP gessner, with editing by me where necessary. Bianca Harrington P. Merla Riches, M.D.

## 2013-07-05 ENCOUNTER — Other Ambulatory Visit: Payer: Self-pay | Admitting: *Deleted

## 2013-07-05 MED ORDER — BUPROPION HCL ER (XL) 300 MG PO TB24: 300.0000 mg | ORAL_TABLET | Freq: Every day | ORAL | Status: DC

## 2013-10-01 ENCOUNTER — Other Ambulatory Visit: Payer: Self-pay | Admitting: Internal Medicine

## 2013-10-03 NOTE — Telephone Encounter (Signed)
Faxed

## 2013-12-28 ENCOUNTER — Other Ambulatory Visit: Payer: Self-pay | Admitting: Internal Medicine

## 2013-12-31 ENCOUNTER — Ambulatory Visit: Payer: BC Managed Care – PPO | Admitting: Internal Medicine

## 2014-01-02 NOTE — Telephone Encounter (Addendum)
This only showed back in my in basket today. Called in.

## 2014-01-28 ENCOUNTER — Ambulatory Visit: Payer: BC Managed Care – PPO | Admitting: Internal Medicine

## 2015-06-21 ENCOUNTER — Encounter: Payer: Self-pay | Admitting: Internal Medicine

## 2016-05-23 DIAGNOSIS — Z23 Encounter for immunization: Secondary | ICD-10-CM | POA: Diagnosis not present

## 2016-05-23 DIAGNOSIS — Z1389 Encounter for screening for other disorder: Secondary | ICD-10-CM | POA: Diagnosis not present

## 2016-05-23 DIAGNOSIS — Z6820 Body mass index (BMI) 20.0-20.9, adult: Secondary | ICD-10-CM | POA: Diagnosis not present

## 2016-05-23 DIAGNOSIS — Z01419 Encounter for gynecological examination (general) (routine) without abnormal findings: Secondary | ICD-10-CM | POA: Diagnosis not present

## 2016-05-29 DIAGNOSIS — N644 Mastodynia: Secondary | ICD-10-CM | POA: Diagnosis not present

## 2016-10-11 DIAGNOSIS — Z6820 Body mass index (BMI) 20.0-20.9, adult: Secondary | ICD-10-CM | POA: Diagnosis not present

## 2016-10-11 DIAGNOSIS — F321 Major depressive disorder, single episode, moderate: Secondary | ICD-10-CM | POA: Diagnosis not present

## 2016-10-11 DIAGNOSIS — F428 Other obsessive-compulsive disorder: Secondary | ICD-10-CM | POA: Diagnosis not present

## 2016-10-11 DIAGNOSIS — M2550 Pain in unspecified joint: Secondary | ICD-10-CM | POA: Diagnosis not present

## 2017-05-15 DIAGNOSIS — R590 Localized enlarged lymph nodes: Secondary | ICD-10-CM | POA: Diagnosis not present

## 2017-05-15 DIAGNOSIS — Z6820 Body mass index (BMI) 20.0-20.9, adult: Secondary | ICD-10-CM | POA: Diagnosis not present

## 2017-05-15 DIAGNOSIS — Z1389 Encounter for screening for other disorder: Secondary | ICD-10-CM | POA: Diagnosis not present

## 2017-05-15 DIAGNOSIS — Z01419 Encounter for gynecological examination (general) (routine) without abnormal findings: Secondary | ICD-10-CM | POA: Diagnosis not present

## 2017-05-15 DIAGNOSIS — Z23 Encounter for immunization: Secondary | ICD-10-CM | POA: Diagnosis not present

## 2017-05-15 DIAGNOSIS — R102 Pelvic and perineal pain: Secondary | ICD-10-CM | POA: Diagnosis not present

## 2017-05-18 DIAGNOSIS — R9389 Abnormal findings on diagnostic imaging of other specified body structures: Secondary | ICD-10-CM | POA: Diagnosis not present

## 2017-05-18 DIAGNOSIS — Z6820 Body mass index (BMI) 20.0-20.9, adult: Secondary | ICD-10-CM | POA: Diagnosis not present

## 2017-05-18 DIAGNOSIS — R590 Localized enlarged lymph nodes: Secondary | ICD-10-CM | POA: Diagnosis not present

## 2017-09-05 DIAGNOSIS — Z30432 Encounter for removal of intrauterine contraceptive device: Secondary | ICD-10-CM | POA: Diagnosis not present

## 2017-09-19 DIAGNOSIS — M25562 Pain in left knee: Secondary | ICD-10-CM | POA: Diagnosis not present

## 2017-09-19 DIAGNOSIS — M25561 Pain in right knee: Secondary | ICD-10-CM | POA: Diagnosis not present

## 2017-09-19 DIAGNOSIS — S83241A Other tear of medial meniscus, current injury, right knee, initial encounter: Secondary | ICD-10-CM | POA: Diagnosis not present

## 2017-09-25 DIAGNOSIS — M25561 Pain in right knee: Secondary | ICD-10-CM | POA: Diagnosis not present

## 2017-10-03 DIAGNOSIS — M170 Bilateral primary osteoarthritis of knee: Secondary | ICD-10-CM | POA: Diagnosis not present

## 2017-10-03 DIAGNOSIS — M6751 Plica syndrome, right knee: Secondary | ICD-10-CM | POA: Diagnosis not present

## 2017-10-10 DIAGNOSIS — G8929 Other chronic pain: Secondary | ICD-10-CM | POA: Diagnosis not present

## 2017-10-10 DIAGNOSIS — M25561 Pain in right knee: Secondary | ICD-10-CM | POA: Diagnosis not present

## 2017-10-10 DIAGNOSIS — M6281 Muscle weakness (generalized): Secondary | ICD-10-CM | POA: Diagnosis not present

## 2017-10-10 DIAGNOSIS — M6751 Plica syndrome, right knee: Secondary | ICD-10-CM | POA: Diagnosis not present

## 2017-11-28 DIAGNOSIS — J019 Acute sinusitis, unspecified: Secondary | ICD-10-CM | POA: Diagnosis not present

## 2017-12-04 DIAGNOSIS — Z3041 Encounter for surveillance of contraceptive pills: Secondary | ICD-10-CM | POA: Diagnosis not present

## 2017-12-04 DIAGNOSIS — Z6820 Body mass index (BMI) 20.0-20.9, adult: Secondary | ICD-10-CM | POA: Diagnosis not present

## 2018-08-21 DIAGNOSIS — Z01419 Encounter for gynecological examination (general) (routine) without abnormal findings: Secondary | ICD-10-CM | POA: Diagnosis not present

## 2018-08-21 DIAGNOSIS — R590 Localized enlarged lymph nodes: Secondary | ICD-10-CM | POA: Diagnosis not present

## 2018-08-21 DIAGNOSIS — Z23 Encounter for immunization: Secondary | ICD-10-CM | POA: Diagnosis not present

## 2018-08-21 DIAGNOSIS — Z3041 Encounter for surveillance of contraceptive pills: Secondary | ICD-10-CM | POA: Diagnosis not present

## 2018-08-21 DIAGNOSIS — Z1331 Encounter for screening for depression: Secondary | ICD-10-CM | POA: Diagnosis not present

## 2018-08-21 DIAGNOSIS — Z6820 Body mass index (BMI) 20.0-20.9, adult: Secondary | ICD-10-CM | POA: Diagnosis not present

## 2018-08-28 DIAGNOSIS — Z1329 Encounter for screening for other suspected endocrine disorder: Secondary | ICD-10-CM | POA: Diagnosis not present

## 2018-08-28 DIAGNOSIS — Z131 Encounter for screening for diabetes mellitus: Secondary | ICD-10-CM | POA: Diagnosis not present

## 2018-08-28 DIAGNOSIS — Z1321 Encounter for screening for nutritional disorder: Secondary | ICD-10-CM | POA: Diagnosis not present

## 2018-08-28 DIAGNOSIS — Z13228 Encounter for screening for other metabolic disorders: Secondary | ICD-10-CM | POA: Diagnosis not present

## 2019-10-04 LAB — HM PAP SMEAR
HM Pap smear: NEGATIVE
HPV 16/18/45 genotyping: NEGATIVE

## 2019-10-15 ENCOUNTER — Ambulatory Visit: Payer: Self-pay | Attending: Internal Medicine

## 2021-04-11 ENCOUNTER — Other Ambulatory Visit (HOSPITAL_BASED_OUTPATIENT_CLINIC_OR_DEPARTMENT_OTHER): Payer: Self-pay | Admitting: Orthopaedic Surgery

## 2021-04-11 DIAGNOSIS — M25561 Pain in right knee: Secondary | ICD-10-CM

## 2021-04-11 DIAGNOSIS — M25562 Pain in left knee: Secondary | ICD-10-CM

## 2021-04-12 ENCOUNTER — Ambulatory Visit (HOSPITAL_BASED_OUTPATIENT_CLINIC_OR_DEPARTMENT_OTHER): Payer: 59 | Admitting: Orthopaedic Surgery

## 2021-04-12 ENCOUNTER — Ambulatory Visit (HOSPITAL_BASED_OUTPATIENT_CLINIC_OR_DEPARTMENT_OTHER)
Admission: RE | Admit: 2021-04-12 | Discharge: 2021-04-12 | Disposition: A | Discharge status: Home / Self Care | Payer: 59 | Source: Ambulatory Visit | Attending: Orthopaedic Surgery | Admitting: Orthopaedic Surgery

## 2021-04-12 ENCOUNTER — Other Ambulatory Visit: Payer: Self-pay

## 2021-04-12 DIAGNOSIS — M222X1 Patellofemoral disorders, right knee: Secondary | ICD-10-CM | POA: Diagnosis not present

## 2021-04-12 DIAGNOSIS — M25561 Pain in right knee: Secondary | ICD-10-CM | POA: Diagnosis present

## 2021-04-12 DIAGNOSIS — M25562 Pain in left knee: Secondary | ICD-10-CM | POA: Diagnosis not present

## 2021-04-12 DIAGNOSIS — M222X2 Patellofemoral disorders, left knee: Secondary | ICD-10-CM | POA: Diagnosis not present

## 2021-04-12 NOTE — Progress Notes (Signed)
Chief Complaint: bilateral knee pian     History of Present Illness:   Pain Score: 2/10 SANE: 80/100  Bianca Harrington is a 40 y.o. female with bilateral knee pain now going on for several years in fact since she says she was under the age use.  She has not been given an official diagnosis for her knees.  She does say that she has a history of being pigeon toed and bowlegged.  She did have an injection approximately 3 years ago in the right knee which gave temporary relief.  She states that the pain is worse with running longer distances or even walking.  She has 2 boys at home and dogs that she needs to take for a walk which causes significant pain in the knees.  She does say that there is audible crepitus in the knee which is associated with pain.  She has particularly difficult time going up and down stairs.  She has tried ibuprofen and Tylenol which only helped a little.  She has taken over-the-counter supplements such as fish oil which does not help.    Surgical History:   None  PMH/PSH/Family History/Social History/Meds/Allergies:    Past Medical History:  Diagnosis Date   Anxiety    Depression    Past Surgical History:  Procedure Laterality Date   APPENDECTOMY     Social History   Socioeconomic History   Marital status: Married    Spouse name: Not on file   Number of children: Not on file   Years of education: Not on file   Highest education level: Not on file  Occupational History   Not on file  Tobacco Use   Smoking status: Former   Smokeless tobacco: Not on file  Substance and Sexual Activity   Alcohol use: Yes   Drug use: No   Sexual activity: Yes    Birth control/protection: Pill  Other Topics Concern   Not on file  Social History Narrative   Not on file   Social Determinants of Health   Financial Resource Strain: Not on file  Food Insecurity: Not on file  Transportation Needs: Not on file  Physical Activity:  Not on file  Stress: Not on file  Social Connections: Not on file   Family History  Problem Relation Age of Onset   Cancer Maternal Grandmother    Cancer Paternal Grandmother    Cancer Paternal Grandfather    Allergies  Allergen Reactions   Penicillins Hives   Current Outpatient Medications  Medication Sig Dispense Refill   ALPRAZolam (XANAX) 0.5 MG tablet TAKE 1 TABLET BY MOUTH 3 TIMES DAILY AS NEEDED 90 tablet 1   buPROPion (WELLBUTRIN XL) 300 MG 24 hr tablet Take 1 tablet (300 mg total) by mouth daily. 30 tablet 5   doxycycline (VIBRA-TABS) 100 MG tablet Take 1 tablet (100 mg total) by mouth 2 (two) times daily. 20 tablet 0   NUVARING 0.12-0.015 MG/24HR vaginal ring      No current facility-administered medications for this visit.   No results found.  Review of Systems:   A ROS was performed including pertinent positives and negatives as documented in the HPI.  Physical Exam :   Constitutional: NAD and appears stated age Neurological: Alert and oriented Psych: Appropriate affect and cooperative There were no vitals taken for  this visit.   Comprehensive Musculoskeletal Exam:     Musculoskeletal Exam  Gait Normal  Alignment Normal   Right Left  Inspection Normal Normal  Palpation    Tenderness Patellofemoral Patellofemoral  Crepitus Patellofemoral Patellofemoral  Effusion None None  Range of Motion    Extension -3 -3  Flexion 130 130  Strength    Extension 5/5 5/5  Flexion 5/5 5/5  Ligament Exam     Generalized Laxity No No  Lachman Negative Negative   Pivot Shift Negative Negative  Anterior Drawer Negative Negative  Valgus at 0 Negative Negative  Valgus at 20 Negative Negative  Varus at 0 0 0  Varus at 20   0 0  Posterior Drawer at 90 0 0  Vascular/Lymphatic Exam    Edema None None  Venous Stasis Changes No No  Distal Circulation Normal Normal  Neurologic    Light Touch Sensation Intact Intact  Special Tests: Left worse than right weakness with  single-leg squat     Imaging:   Xray (right knee 4 views, left knee 4 views): Normal knee  I personally reviewed and interpreted the radiographs.   Assessment:   40 year old female with bilateral patellofemoral type pain and crepitus in the setting of hip and quad weakness.  We discussed the importance of building the strength of her proximal chain on the overall health of her knee.  As such I would like to refer to physical therapy for core hip and quad strengthening.  We discussed that ultimately if she does not improve in 4 to 6 weeks that an MRI may be warranted.  Plan :    -Physical therapy ordered for bilateral hip quad and knee strengthening in the setting of patellofemoral pain -Return to clinic in 6 weeks if no improvement with physical therapy    I personally saw and evaluated the patient, and participated in the management and treatment plan.  Huel Cote, MD Attending Physician, Orthopedic Surgery  This document was dictated using Dragon voice recognition software. A reasonable attempt at proof reading has been made to minimize errors.

## 2022-06-12 ENCOUNTER — Encounter: Payer: Self-pay | Admitting: Family Medicine

## 2022-06-12 ENCOUNTER — Ambulatory Visit (INDEPENDENT_AMBULATORY_CARE_PROVIDER_SITE_OTHER): Payer: BC Managed Care – PPO | Admitting: Family Medicine

## 2022-06-12 VITALS — BP 88/50 | HR 65 | Temp 98.5°F | Ht 66.5 in | Wt 129.5 lb

## 2022-06-12 DIAGNOSIS — Z0000 Encounter for general adult medical examination without abnormal findings: Secondary | ICD-10-CM | POA: Insufficient documentation

## 2022-06-12 DIAGNOSIS — K219 Gastro-esophageal reflux disease without esophagitis: Secondary | ICD-10-CM | POA: Diagnosis not present

## 2022-06-12 DIAGNOSIS — G4700 Insomnia, unspecified: Secondary | ICD-10-CM | POA: Insufficient documentation

## 2022-06-12 DIAGNOSIS — G4709 Other insomnia: Secondary | ICD-10-CM

## 2022-06-12 DIAGNOSIS — Z23 Encounter for immunization: Secondary | ICD-10-CM

## 2022-06-12 DIAGNOSIS — Z111 Encounter for screening for respiratory tuberculosis: Secondary | ICD-10-CM

## 2022-06-12 LAB — COMPREHENSIVE METABOLIC PANEL
ALT: 11 U/L (ref 0–35)
AST: 19 U/L (ref 0–37)
Albumin: 4.6 g/dL (ref 3.5–5.2)
Alkaline Phosphatase: 31 U/L — ABNORMAL LOW (ref 39–117)
BUN: 13 mg/dL (ref 6–23)
CO2: 26 mEq/L (ref 19–32)
Calcium: 9.6 mg/dL (ref 8.4–10.5)
Chloride: 103 mEq/L (ref 96–112)
Creatinine, Ser: 0.73 mg/dL (ref 0.40–1.20)
GFR: 103.69 mL/min (ref >60.00)
Glucose, Bld: 97 mg/dL (ref 70–99)
Potassium: 4.8 mEq/L (ref 3.5–5.1)
Sodium: 137 mEq/L (ref 135–145)
Total Bilirubin: 0.5 mg/dL (ref 0.2–1.2)
Total Protein: 7.3 g/dL (ref 6.0–8.3)

## 2022-06-12 LAB — TSH: TSH: 1.35 u[IU]/mL (ref 0.35–5.50)

## 2022-06-12 MED ORDER — TRAZODONE HCL 50 MG PO TABS: 25.0000 mg | ORAL_TABLET | Freq: Every evening | ORAL | 0 refills | Status: DC | PRN

## 2022-06-12 NOTE — Progress Notes (Unsigned)
Complete physical exam  Patient: Bianca Harrington   DOB: Jul 25, 1980   41 y.o. Female  MRN: [id number]  Subjective:    Chief Complaint  Patient presents with   Establish Care    Bianca Harrington is a 41 y.o. female who presents today for a complete physical exam. She reports consuming a general diet. Home exercise routine includes walking. She generally feels well. She reports sleeping well. She does have additional problems to discuss today.    Insomnia--pt is reporting a 13 year history of nighttime awakenings. States that it started after she had kids. States that she goes to bed around 9 pm and then randomly wakes up around 2-3 am, states that she starts thinking about things and then cannot get back to sleep. Is taking an OTC sleep aid which helps her fall asleep but then it does not help with staying asleep.  States that she has a history of anxiety but is not on any medication for this.  GERD-- States it has been years since she has had a pcp, usually just goes to urgent care. Patient is reporting an increase in heartburn. States that it has been getting worse progressively over the last year or so. States that she has changed her diet, symptoms are daily, pretty mch with every meal. Sometimes she feels bloated, but no significant changes in her stool, no changes in color, no abdominal pain, nausea or vomiting.   Most recent fall risk assessment:     No data to display           Most recent depression screenings:    06/12/2022    4:29 PM  PHQ 2/9 Scores  PHQ - 2 Score 0  PHQ- 9 Score 4    Dental: No current dental problems and Receives regular dental care  Patient Active Problem List   Diagnosis Date Noted   GERD (gastroesophageal reflux disease) 06/13/2022   Healthy adult on routine physical examination 06/12/2022   Insomnia 06/12/2022   OCD (obsessive compulsive disorder) 10/21/2012      Patient Care Team: Karie Georges, MD as PCP - General (Family  Medicine)   Outpatient Medications Prior to Visit  Medication Sig   Multiple Vitamins-Minerals (MULTIVITAMIN ADULTS PO) Take by mouth.   Norethindrone-Ethinyl Estradiol-Fe Biphas (LO LOESTRIN FE) 1 MG-10 MCG / 10 MCG tablet Take 1 tablet by mouth daily.   [DISCONTINUED] ALPRAZolam (XANAX) 0.5 MG tablet TAKE 1 TABLET BY MOUTH 3 TIMES DAILY AS NEEDED   [DISCONTINUED] buPROPion (WELLBUTRIN XL) 300 MG 24 hr tablet Take 1 tablet (300 mg total) by mouth daily.   [DISCONTINUED] doxycycline (VIBRA-TABS) 100 MG tablet Take 1 tablet (100 mg total) by mouth 2 (two) times daily.   [DISCONTINUED] NUVARING 0.12-0.015 MG/24HR vaginal ring    No facility-administered medications prior to visit.    Review of Systems  HENT:  Negative for hearing loss.   Eyes:  Negative for blurred vision.  Respiratory:  Negative for shortness of breath.   Cardiovascular:  Negative for chest pain.  Gastrointestinal: Negative.   Genitourinary: Negative.   Musculoskeletal:  Negative for back pain.  Neurological:  Negative for headaches.  Psychiatric/Behavioral:  Negative for depression.           Objective:     BP (!) 88/50 (BP Location: Left Arm, Patient Position: Sitting, Cuff Size: Normal)   Pulse 65   Temp 98.5 F (36.9 C) (Oral)   Ht 5' 6.5" (1.689 m)   Wt 129 lb 8  oz (58.7 kg)   SpO2 100%   BMI 20.59 kg/m    Physical Exam Vitals reviewed.  Constitutional:      Appearance: Normal appearance. She is well-groomed and normal weight.  HENT:     Right Ear: Tympanic membrane and ear canal normal.     Left Ear: Tympanic membrane and ear canal normal.     Mouth/Throat:     Mouth: Mucous membranes are moist.     Pharynx: Oropharynx is clear.  Eyes:     Conjunctiva/sclera: Conjunctivae normal.  Neck:     Thyroid: No thyromegaly.  Cardiovascular:     Rate and Rhythm: Normal rate and regular rhythm.     Pulses: Normal pulses.     Heart sounds: S1 normal and S2 normal.  Pulmonary:     Effort:  Pulmonary effort is normal.     Breath sounds: Normal breath sounds and air entry.  Abdominal:     General: Bowel sounds are normal.  Musculoskeletal:     Right lower leg: No edema.     Left lower leg: No edema.  Neurological:     Mental Status: She is alert and oriented to person, place, and time. Mental status is at baseline.     Gait: Gait is intact.  Psychiatric:        Mood and Affect: Mood and affect normal.        Speech: Speech normal.        Behavior: Behavior normal.        Judgment: Judgment normal.      Results for orders placed or performed in visit on 06/12/22  CMP  Result Value Ref Range   Sodium 137 135 - 145 mEq/L   Potassium 4.8 3.5 - 5.1 mEq/L   Chloride 103 96 - 112 mEq/L   CO2 26 19 - 32 mEq/L   Glucose, Bld 97 70 - 99 mg/dL   BUN 13 6 - 23 mg/dL   Creatinine, Ser 0.73 0.40 - 1.20 mg/dL   Total Bilirubin 0.5 0.2 - 1.2 mg/dL   Alkaline Phosphatase 31 (L) 39 - 117 U/L   AST 19 0 - 37 U/L   ALT 11 0 - 35 U/L   Total Protein 7.3 6.0 - 8.3 g/dL   Albumin 4.6 3.5 - 5.2 g/dL   GFR 103.69 >60.00 mL/min   Calcium 9.6 8.4 - 10.5 mg/dL  TSH  Result Value Ref Range   TSH 1.35 0.35 - 5.50 uIU/mL       Assessment & Plan:    Routine Health Maintenance and Physical Exam  Immunization History  Administered Date(s) Administered   Influenza,inj,Quad PF,6+ Mos 04/09/2013, 06/12/2022   PFIZER Comirnaty(Gray Top)Covid-19 Tri-Sucrose Vaccine 10/22/2020   Tdap 04/09/2013    Health Maintenance  Topic Date Due   PAP SMEAR-Modifier  Never done   COVID-19 Vaccine (2 - 2023-24 season) 03/24/2022   Hepatitis C Screening  06/13/2023 (Originally 02/26/2001)   HIV Screening  06/13/2023 (Originally 02/26/1998)   INFLUENZA VACCINE  Completed   HPV VACCINES  Aged Out    Discussed health benefits of physical activity, and encouraged her to engage in regular exercise appropriate for her age and condition.  Problem List Items Addressed This Visit       Unprioritized    Healthy adult on routine physical examination   Relevant Orders   CMP (Completed)   Insomnia    Chronic, ongoing, pt has been using OTC sleep aids but is still having nighttime awakenings.  I recommended trying trazodone 25- 50 mg at bedtime to see if this will help her stay asleep      Relevant Medications   traZODone (DESYREL) 50 MG tablet   Other Relevant Orders   TSH (Completed)   GERD (gastroesophageal reflux disease) - Primary (Chronic)    I advised she increase her treatment to famotidine 20 mg BID OTC. I will order a Urea breath test to rule out H. Pylori infection.       Relevant Orders   H. pylori breath test   Other Visit Diagnoses     Screening for tuberculosis       Relevant Orders   QuantiFERON-TB Gold Plus   Need for immunization against influenza       Relevant Orders   Flu Vaccine QUAD 6+ mos PF IM (Fluarix Quad PF) (Completed)      Return in about 1 year (around 06/13/2023).     Farrel Conners, MD

## 2022-06-12 NOTE — Patient Instructions (Signed)
Start pepcid (famotidine) 20 mg twice a day or 40 mg once a day.

## 2022-06-13 DIAGNOSIS — K219 Gastro-esophageal reflux disease without esophagitis: Secondary | ICD-10-CM | POA: Insufficient documentation

## 2022-06-13 NOTE — Assessment & Plan Note (Signed)
Chronic, ongoing, pt has been using OTC sleep aids but is still having nighttime awakenings. I recommended trying trazodone 25- 50 mg at bedtime to see if this will help her stay asleep

## 2022-06-13 NOTE — Assessment & Plan Note (Signed)
I advised she increase her treatment to famotidine 20 mg BID OTC. I will order a Urea breath test to rule out H. Pylori infection.

## 2022-06-18 LAB — QUANTIFERON-TB GOLD PLUS
Mitogen-NIL: >10 IU/mL
NIL: 0.07 IU/mL
QuantiFERON-TB Gold Plus: POSITIVE — AB
TB1-NIL: 0.23 IU/mL
TB2-NIL: 0.39 IU/mL

## 2022-06-19 ENCOUNTER — Encounter: Payer: Self-pay | Admitting: Family Medicine

## 2022-06-19 DIAGNOSIS — R7612 Nonspecific reaction to cell mediated immunity measurement of gamma interferon antigen response without active tuberculosis: Secondary | ICD-10-CM

## 2022-06-19 NOTE — Progress Notes (Signed)
I ordered a chest x-ray to be done at [facility]-- I already messaged pt over my chart.

## 2022-06-26 ENCOUNTER — Ambulatory Visit
Admission: RE | Admit: 2022-06-26 | Discharge: 2022-06-26 | Disposition: A | Discharge status: Home / Self Care | Payer: BC Managed Care – PPO | Source: Ambulatory Visit | Attending: Family Medicine | Admitting: Family Medicine

## 2022-06-26 DIAGNOSIS — R7612 Nonspecific reaction to cell mediated immunity measurement of gamma interferon antigen response without active tuberculosis: Secondary | ICD-10-CM

## 2022-06-26 DIAGNOSIS — R7611 Nonspecific reaction to tuberculin skin test without active tuberculosis: Secondary | ICD-10-CM | POA: Diagnosis not present

## 2022-06-28 ENCOUNTER — Other Ambulatory Visit: Payer: BC Managed Care – PPO

## 2022-06-28 DIAGNOSIS — R7612 Nonspecific reaction to cell mediated immunity measurement of gamma interferon antigen response without active tuberculosis: Secondary | ICD-10-CM

## 2022-06-30 LAB — QUANTIFERON-TB GOLD PLUS
Mitogen-NIL: >10 IU/mL
NIL: 0.7 IU/mL
QuantiFERON-TB Gold Plus: NEGATIVE
TB1-NIL: 0.03 IU/mL
TB2-NIL: <0 IU/mL

## 2022-07-03 NOTE — Progress Notes (Signed)
Repeat testing is negative

## 2022-09-25 ENCOUNTER — Encounter: Payer: Self-pay | Admitting: Family Medicine

## 2022-09-26 ENCOUNTER — Telehealth: Payer: BC Managed Care – PPO | Admitting: Family Medicine

## 2022-09-26 ENCOUNTER — Encounter: Payer: Self-pay | Admitting: Family Medicine

## 2022-09-26 VITALS — BP 104/78 | HR 70

## 2022-09-26 DIAGNOSIS — F321 Major depressive disorder, single episode, moderate: Secondary | ICD-10-CM | POA: Diagnosis not present

## 2022-09-26 MED ORDER — BUPROPION HCL ER (XL) 150 MG PO TB24: 150.0000 mg | ORAL_TABLET | Freq: Every day | ORAL | 1 refills | Status: DC

## 2022-09-26 NOTE — Assessment & Plan Note (Signed)
Characterized by anhedonia and lack of motivation. She is going to school now and this is adding to her usual stress level. We had a discussion about medication options and she wants to restart her wellbutrin 150 mg daily that she has been on in the past. I have sent an rx for this medication and I will see her in another video visit in 3-4 months to follow up.

## 2022-09-26 NOTE — Progress Notes (Signed)
Virtual Medical Office Visit  Patient:  Bianca Harrington      Age: 42 y.o.       Sex:  female  Date:   09/26/2022  PCP:    Farrel Conners, MD   [facility] Provider: Farrel Conners, MD    Assessment/Plan:   Summary assessment:  Bianca Harrington was seen today for depression.  Depression, major, single episode, moderate (HCC) Assessment & Plan: Characterized by anhedonia and lack of motivation. She is going to school now and this is adding to her usual stress level. We had a discussion about medication options and she wants to restart her wellbutrin 150 mg daily that she has been on in the past. I have sent an rx for this medication and I will see her in another video visit in 3-4 months to follow up.   Orders: -     buPROPion HCl ER (XL); Take 1 tablet (150 mg total) by mouth daily.  Dispense: 90 tablet; Refill: 1     Return in about 4 months (around 01/26/2023) for video visit to follow up on depression symptoms.   She was advised to call the office or go to ER if her condition worsens    Subjective:   Bianca Harrington is a 42 y.o. female with PMH significant for: Past Medical History:  Diagnosis Date   Anxiety    Depression    GERD (gastroesophageal reflux disease)    History of chickenpox      Presenting today with: Chief Complaint  Patient presents with   Depression    Patient complains of increased depression x1 month, initially suspected stress due to starting school recently     She clarifies and reports that her condition: Pt was trialed on trazodone for sleep, states that it wasn't really helping. States that her sleep pattern has been "ok", taking [employer] brand OTC sleep aid which seems to help some of the time.  Pt reports her GERD is under control, made to changes to her diet and this seemed to help with her symptoms. Was unable to get the urea breath test due to having an episode of covid, states that since her symptoms improved she  decided. Pt is reporting an increase in depressive symptoms. States that there are no exacerbating factors, but thinks that it is just the life stressors that are bothering her. States that she went back to school and is feeling overwhelming. No real crying spells, no SI, more of having difficulty with disinterest and motivation. States that she was on wellbutrin before and this seemed to be helping. Has been on celexa in the past, also has had xanax PRN in the past.           Objective/Observations  Physical Exam:  Polite and friendly Gen: NAD, resting comfortably Pulm: Normal work of breathing Neuro: Grossly normal, moves all extremities Psych: Normal affect and thought content   No images are attached to the encounter or orders placed in the encounter.    Results: No results found for any visits on 09/26/22.   No results found for this or any previous visit (from the past 2160 hour(s)).         Virtual Visit via Video   I connected with Bianca Harrington on 09/26/22 at  4:30 PM EST by a video enabled telemedicine application and verified that I am speaking with the correct person using two identifiers. The limitations of evaluation and management by telemedicine and the availability of in  person appointments were discussed. The patient expressed understanding and agreed to proceed.   Percentage of appointment time on video:  100% Patient location: Home Provider location: [facility] Persons participating in the virtual visit: Myself and Patient

## 2022-12-01 DIAGNOSIS — Z01419 Encounter for gynecological examination (general) (routine) without abnormal findings: Secondary | ICD-10-CM | POA: Diagnosis not present

## 2022-12-01 DIAGNOSIS — Z1231 Encounter for screening mammogram for malignant neoplasm of breast: Secondary | ICD-10-CM | POA: Diagnosis not present

## 2022-12-01 DIAGNOSIS — Z130 Encounter for screening for diseases of the blood and blood-forming organs and certain disorders involving the immune mechanism: Secondary | ICD-10-CM | POA: Diagnosis not present

## 2022-12-01 LAB — HM MAMMOGRAPHY

## 2022-12-05 ENCOUNTER — Other Ambulatory Visit: Payer: Self-pay | Admitting: Obstetrics and Gynecology

## 2022-12-05 DIAGNOSIS — R928 Other abnormal and inconclusive findings on diagnostic imaging of breast: Secondary | ICD-10-CM

## 2022-12-15 ENCOUNTER — Ambulatory Visit
Admission: RE | Admit: 2022-12-15 | Discharge: 2022-12-15 | Disposition: A | Discharge status: Home / Self Care | Payer: BC Managed Care – PPO | Source: Ambulatory Visit | Attending: Obstetrics and Gynecology | Admitting: Obstetrics and Gynecology

## 2022-12-15 ENCOUNTER — Other Ambulatory Visit: Payer: Self-pay | Admitting: Obstetrics and Gynecology

## 2022-12-15 DIAGNOSIS — N6001 Solitary cyst of right breast: Secondary | ICD-10-CM | POA: Diagnosis not present

## 2022-12-15 DIAGNOSIS — N6489 Other specified disorders of breast: Secondary | ICD-10-CM | POA: Diagnosis not present

## 2022-12-15 DIAGNOSIS — R92341 Mammographic extreme density, right breast: Secondary | ICD-10-CM | POA: Diagnosis not present

## 2022-12-15 DIAGNOSIS — R928 Other abnormal and inconclusive findings on diagnostic imaging of breast: Secondary | ICD-10-CM

## 2022-12-15 DIAGNOSIS — R922 Inconclusive mammogram: Secondary | ICD-10-CM | POA: Diagnosis not present

## 2022-12-15 DIAGNOSIS — R921 Mammographic calcification found on diagnostic imaging of breast: Secondary | ICD-10-CM | POA: Diagnosis not present

## 2022-12-19 ENCOUNTER — Other Ambulatory Visit: Payer: Self-pay | Admitting: Obstetrics and Gynecology

## 2022-12-19 DIAGNOSIS — R921 Mammographic calcification found on diagnostic imaging of breast: Secondary | ICD-10-CM

## 2023-06-06 ENCOUNTER — Telehealth: Payer: Self-pay | Admitting: Family Medicine

## 2023-06-06 NOTE — Telephone Encounter (Signed)
Requesting labs to prove hep B, MMR, varicella and TB

## 2023-06-07 ENCOUNTER — Ambulatory Visit (INDEPENDENT_AMBULATORY_CARE_PROVIDER_SITE_OTHER): Payer: BC Managed Care – PPO | Admitting: Family Medicine

## 2023-06-07 ENCOUNTER — Encounter: Payer: Self-pay | Admitting: Family Medicine

## 2023-06-07 VITALS — BP 110/78 | HR 81 | Temp 98.6°F | Ht 66.5 in | Wt 136.5 lb

## 2023-06-07 DIAGNOSIS — Z111 Encounter for screening for respiratory tuberculosis: Secondary | ICD-10-CM | POA: Diagnosis not present

## 2023-06-07 DIAGNOSIS — Z789 Other specified health status: Secondary | ICD-10-CM

## 2023-06-07 DIAGNOSIS — F321 Major depressive disorder, single episode, moderate: Secondary | ICD-10-CM | POA: Diagnosis not present

## 2023-06-07 DIAGNOSIS — Z0184 Encounter for antibody response examination: Secondary | ICD-10-CM | POA: Diagnosis not present

## 2023-06-07 MED ORDER — BUPROPION HCL ER (XL) 150 MG PO TB24: 150.0000 mg | ORAL_TABLET | Freq: Every day | ORAL | 1 refills | Status: AC

## 2023-06-07 NOTE — Progress Notes (Signed)
Established Patient Office Visit  Subjective   Patient ID: Bianca Harrington, female    DOB: Nov 09, 1980  Age: 42 y.o. MRN: [id number]  Chief Complaint  Patient presents with   Results    Patient requests lab testing required by nursing program    Patient is here for follow up.   Depression -- pt is doing well on the wellbutrin 150 mg daily. 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 She needs refills today on her wellbutrin. She denies any side effects to the medication, no new symptoms or issues to discuss.     Current Outpatient Medications  Medication Instructions   buPROPion (WELLBUTRIN XL) 150 mg, Oral, Daily   Multiple Vitamins-Minerals (MULTIVITAMIN ADULTS PO) Oral   Norethindrone-Ethinyl Estradiol-Fe Biphas (LO LOESTRIN FE) 1 MG-10 MCG / 10 MCG tablet 1 tablet, Oral, Daily    Patient Active Problem List   Diagnosis Date Noted   Depression, major, single episode, moderate (HCC) 09/26/2022   GERD (gastroesophageal reflux disease) 06/13/2022   Healthy adult on routine physical examination 06/12/2022   Insomnia 06/12/2022   OCD (obsessive compulsive disorder) 10/21/2012      Review of Systems  All other systems reviewed and are negative.     Objective:     BP 110/78 (BP Location: Left Arm, Patient Position: Sitting, Cuff Size: Normal)   Pulse 81   Temp 98.6 F (37 C) (Oral)   Ht 5' 6.5" (1.689 m)   Wt 136 lb 8 oz (61.9 kg)   SpO2 100%   BMI 21.70 kg/m     Physical Exam Vitals reviewed.  Constitutional:      Appearance: Normal appearance. She is well-groomed and normal weight.  Cardiovascular:     Rate and Rhythm: Normal rate and regular rhythm.     Pulses: Normal pulses.     Heart sounds: S1 normal and S2 normal.  Pulmonary:     Effort: Pulmonary effort is normal.     Breath sounds: Normal breath sounds and air entry.  Neurological:     Mental Status: She is alert and oriented to  person, place, and time. Mental status is at baseline.     Gait: Gait is intact.  Psychiatric:        Mood and Affect: Mood and affect normal.        Speech: Speech normal.        Behavior: Behavior normal.      The ASCVD Risk score (Arnett DK, et al., 2019) failed to calculate for the following reasons:   Cannot find a previous HDL lab   Cannot find a previous total cholesterol lab    Assessment & Plan:  Unknown varicella vaccination status -     Varicella zoster antibody, IgG  Immunity to measles, mumps, and rubella determined by serologic test -     Measles/Mumps/Rubella Immunity  Hepatitis B vaccination status unknown -     Hepatitis B surface antibody,qualitative  Tuberculosis screening -     QuantiFERON-TB Gold Plus  Depression, major, single episode, moderate (HCC) -     buPROPion HCl ER (XL); Take 1 tablet (150 mg total) by mouth daily.  Dispense: 90 tablet; Refill: 1   Symptoms are well controlled on the wellbutrin, will refill this for patient and continue as prescribed. RTC in 6 months for annual physical.   Return in about 6 months (around 12/05/2023) for annual physical exam.    Karie Georges,  MD

## 2023-06-09 LAB — VARICELLA ZOSTER ANTIBODY, IGG: Varicella IgG: 7.3 {s_co_ratio}

## 2023-06-09 LAB — MEASLES/MUMPS/RUBELLA IMMUNITY
Mumps IgG: 18.1 [AU]/ml
Rubella: 4.21 {index}
Rubeola IgG: <13.5 [AU]/ml — ABNORMAL LOW

## 2023-06-09 LAB — QUANTIFERON-TB GOLD PLUS
Mitogen-NIL: 7.43 [IU]/mL
NIL: 0.02 [IU]/mL
QuantiFERON-TB Gold Plus: NEGATIVE
TB1-NIL: 0.01 [IU]/mL
TB2-NIL: 0.01 [IU]/mL

## 2023-06-09 LAB — HEPATITIS B SURFACE ANTIBODY,QUALITATIVE: Hep B S Ab: REACTIVE — AB

## 2023-06-18 ENCOUNTER — Other Ambulatory Visit: Payer: Self-pay | Admitting: Obstetrics and Gynecology

## 2023-06-18 ENCOUNTER — Ambulatory Visit
Admission: RE | Admit: 2023-06-18 | Discharge: 2023-06-18 | Disposition: A | Discharge status: Home / Self Care | Payer: BC Managed Care – PPO | Source: Ambulatory Visit | Attending: Obstetrics and Gynecology | Admitting: Obstetrics and Gynecology

## 2023-06-18 DIAGNOSIS — R921 Mammographic calcification found on diagnostic imaging of breast: Secondary | ICD-10-CM | POA: Diagnosis not present

## 2023-06-26 ENCOUNTER — Ambulatory Visit: Payer: BC Managed Care – PPO | Admitting: Family Medicine

## 2023-06-26 ENCOUNTER — Encounter: Payer: Self-pay | Admitting: Family Medicine

## 2023-06-26 VITALS — BP 102/64 | HR 74 | Temp 98.4°F | Ht 67.25 in | Wt 136.8 lb

## 2023-06-26 DIAGNOSIS — Z0000 Encounter for general adult medical examination without abnormal findings: Secondary | ICD-10-CM

## 2023-06-26 DIAGNOSIS — Z1159 Encounter for screening for other viral diseases: Secondary | ICD-10-CM

## 2023-06-26 DIAGNOSIS — Z13220 Encounter for screening for lipoid disorders: Secondary | ICD-10-CM | POA: Diagnosis not present

## 2023-06-26 NOTE — Patient Instructions (Signed)

## 2023-06-26 NOTE — Progress Notes (Signed)
Complete physical exam  Patient: Bianca Harrington   DOB: 1981/05/10   42 y.o. Female  MRN: [id number]  Subjective:    Chief Complaint  Patient presents with   Annual Exam    Bianca Harrington is a 42 y.o. female who presents today for a complete physical exam. She reports consuming a general diet. Home exercise routine includes walks, runs, rides peleton bike 3-4 times per week. She generally feels well. She reports sleeping fairly well. She does not have additional problems to discuss today.    Most recent fall risk assessment:     No data to display           Most recent depression screenings:    06/26/2023    3:57 PM 06/07/2023    3:01 PM  PHQ 2/9 Scores  PHQ - 2 Score 0 0  PHQ- 9 Score 5 2    Vision:Within last year and Dental: No current dental problems and Receives regular dental care  Patient Active Problem List   Diagnosis Date Noted   Depression, major, single episode, moderate (HCC) 09/26/2022   GERD (gastroesophageal reflux disease) 06/13/2022   Healthy adult on routine physical examination 06/12/2022   Insomnia 06/12/2022   OCD (obsessive compulsive disorder) 10/21/2012      Patient Care Team: Karie Georges, MD as PCP - General (Family Medicine)   Outpatient Medications Prior to Visit  Medication Sig   buPROPion (WELLBUTRIN XL) 150 MG 24 hr tablet Take 1 tablet (150 mg total) by mouth daily.   Multiple Vitamins-Minerals (MULTIVITAMIN ADULTS PO) Take by mouth.   Norethindrone-Ethinyl Estradiol-Fe Biphas (LO LOESTRIN FE) 1 MG-10 MCG / 10 MCG tablet Take 1 tablet by mouth daily.   No facility-administered medications prior to visit.    Review of Systems  HENT:  Negative for hearing loss.   Eyes:  Negative for blurred vision.  Respiratory:  Negative for shortness of breath.   Cardiovascular:  Negative for chest pain.  Gastrointestinal: Negative.   Genitourinary: Negative.   Musculoskeletal:  Negative for back pain.  Neurological:   Negative for headaches.  Psychiatric/Behavioral:  Negative for depression.    Hearing Screening   500Hz  1000Hz  2000Hz  4000Hz   Right ear Pass Pass Pass Pass  Left ear Pass Pass Pass Pass   Vision Screening   Right eye Left eye Both eyes  Without correction 20/32 20/50   With correction           Objective:     BP 102/64 (BP Location: Left Arm, Patient Position: Sitting, Cuff Size: Normal)   Pulse 74   Temp 98.4 F (36.9 C) (Oral)   Ht 5' 7.25" (1.708 m)   Wt 136 lb 12.8 oz (62.1 kg)   SpO2 99%   BMI 21.27 kg/m    Physical Exam Vitals reviewed.  Constitutional:      Appearance: Normal appearance. She is well-groomed and normal weight.  HENT:     Right Ear: Tympanic membrane and ear canal normal.     Left Ear: Tympanic membrane and ear canal normal.     Mouth/Throat:     Mouth: Mucous membranes are moist.     Pharynx: No posterior oropharyngeal erythema.  Eyes:     Conjunctiva/sclera: Conjunctivae normal.  Neck:     Thyroid: No thyromegaly.  Cardiovascular:     Rate and Rhythm: Normal rate and regular rhythm.     Pulses: Normal pulses.     Heart sounds: S1 normal and S2 normal.  Pulmonary:  Effort: Pulmonary effort is normal.     Breath sounds: Normal breath sounds and air entry.  Abdominal:     General: Abdomen is flat. Bowel sounds are normal.     Palpations: Abdomen is soft.  Musculoskeletal:     Right lower leg: No edema.     Left lower leg: No edema.  Lymphadenopathy:     Cervical: No cervical adenopathy.  Neurological:     Mental Status: She is alert and oriented to person, place, and time. Mental status is at baseline.     Gait: Gait is intact.  Psychiatric:        Mood and Affect: Mood and affect normal.        Speech: Speech normal.        Behavior: Behavior normal.        Judgment: Judgment normal.      No results found for any visits on 06/26/23.     Assessment & Plan:    Routine Health Maintenance and Physical Exam  Immunization  History  Administered Date(s) Administered   Influenza,inj,Quad PF,6+ Mos 04/09/2013, 06/12/2022   Influenza-Unspecified 05/18/2023   PFIZER Comirnaty(Gray Top)Covid-19 Tri-Sucrose Vaccine 10/22/2020   PFIZER(Purple Top)SARS-COV-2 Vaccination 10/06/2019, 10/27/2019, 06/25/2020, 08/30/2021   Pfizer Covid-19 Vaccine Bivalent Booster 69yrs & up 05/24/2022, 05/18/2023   Tdap 04/09/2013, 05/18/2023    Health Maintenance  Topic Date Due   Hepatitis C Screening  Never done   HIV Screening  06/25/2024 (Originally 02/26/1998)   COVID-19 Vaccine (8 - 2023-24 season) 07/13/2023   Cervical Cancer Screening (HPV/Pap Cotest)  10/03/2024   DTaP/Tdap/Td (3 - Td or Tdap) 05/17/2033   INFLUENZA VACCINE  Completed   HPV VACCINES  Aged Out    Discussed health benefits of physical activity, and encouraged her to engage in regular exercise appropriate for her age and condition.  Routine general medical examination at a health care facility -     Comprehensive metabolic panel -     CBC with Differential/Platelet  Lipid screening -     Lipid panel; Future  Need for hepatitis C screening test -     Hepatitis C antibody  Healthy adult on routine physical examination  Normal physical exam findings today, counseled patient on healthy sleep habits, handouts given. 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   Return in 1 year (on 06/25/2024).     Karie Georges, MD

## 2023-06-27 LAB — CBC WITH DIFFERENTIAL/PLATELET
Basophils Absolute: 0 10*3/uL (ref 0.0–0.1)
Basophils Relative: 0.7 % (ref 0.0–3.0)
Eosinophils Absolute: 0.1 10*3/uL (ref 0.0–0.7)
Eosinophils Relative: 1.1 % (ref 0.0–5.0)
HCT: 40.8 % (ref 36.0–46.0)
Hemoglobin: 13.8 g/dL (ref 12.0–15.0)
Lymphocytes Relative: 33.8 % (ref 12.0–46.0)
Lymphs Abs: 2.1 10*3/uL (ref 0.7–4.0)
MCHC: 33.9 g/dL (ref 30.0–36.0)
MCV: 93.8 fL (ref 78.0–100.0)
Monocytes Absolute: 0.5 10*3/uL (ref 0.1–1.0)
Monocytes Relative: 7.6 % (ref 3.0–12.0)
Neutro Abs: 3.5 10*3/uL (ref 1.4–7.7)
Neutrophils Relative %: 56.8 % (ref 43.0–77.0)
Platelets: 248 10*3/uL (ref 150.0–400.0)
RBC: 4.35 Mil/uL (ref 3.87–5.11)
RDW: 12.9 % (ref 11.5–15.5)
WBC: 6.1 10*3/uL (ref 4.0–10.5)

## 2023-06-27 LAB — LIPID PANEL
Cholesterol: 222 mg/dL — ABNORMAL HIGH (ref 0–200)
HDL: 81 mg/dL (ref >39.00)
LDL Cholesterol: 122 mg/dL — ABNORMAL HIGH (ref 0–99)
NonHDL: 140.68
Total CHOL/HDL Ratio: 3
Triglycerides: 91 mg/dL (ref 0.0–149.0)
VLDL: 18.2 mg/dL (ref 0.0–40.0)

## 2023-06-27 LAB — COMPREHENSIVE METABOLIC PANEL
ALT: 9 U/L (ref 0–35)
AST: 14 U/L (ref 0–37)
Albumin: 4.5 g/dL (ref 3.5–5.2)
Alkaline Phosphatase: 36 U/L — ABNORMAL LOW (ref 39–117)
BUN: 22 mg/dL (ref 6–23)
CO2: 24 meq/L (ref 19–32)
Calcium: 9.5 mg/dL (ref 8.4–10.5)
Chloride: 102 meq/L (ref 96–112)
Creatinine, Ser: 0.77 mg/dL (ref 0.40–1.20)
GFR: 96.55 mL/min (ref >60.00)
Glucose, Bld: 101 mg/dL — ABNORMAL HIGH (ref 70–99)
Potassium: 3.7 meq/L (ref 3.5–5.1)
Sodium: 139 meq/L (ref 135–145)
Total Bilirubin: 0.5 mg/dL (ref 0.2–1.2)
Total Protein: 7.1 g/dL (ref 6.0–8.3)

## 2023-06-27 LAB — HEPATITIS C ANTIBODY: Hepatitis C Ab: NONREACTIVE

## 2023-07-30 DIAGNOSIS — D2239 Melanocytic nevi of other parts of face: Secondary | ICD-10-CM | POA: Diagnosis not present

## 2023-07-30 DIAGNOSIS — D492 Neoplasm of unspecified behavior of bone, soft tissue, and skin: Secondary | ICD-10-CM | POA: Diagnosis not present

## 2023-08-24 DIAGNOSIS — Z0184 Encounter for antibody response examination: Secondary | ICD-10-CM | POA: Diagnosis not present

## 2023-10-30 ENCOUNTER — Ambulatory Visit: Payer: Self-pay

## 2023-10-30 ENCOUNTER — Ambulatory Visit (INDEPENDENT_AMBULATORY_CARE_PROVIDER_SITE_OTHER): Admitting: Family Medicine

## 2023-10-30 VITALS — BP 130/80 | HR 75 | Temp 98.1°F | Wt 140.0 lb

## 2023-10-30 DIAGNOSIS — T300 Burn of unspecified body region, unspecified degree: Secondary | ICD-10-CM

## 2023-10-30 MED ORDER — SILVER SULFADIAZINE 1 % EX CREA: 1.0000 | TOPICAL_CREAM | Freq: Two times a day (BID) | CUTANEOUS | 1 refills | Status: AC
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
## 2023-10-30 NOTE — Patient Instructions (Addendum)
-  Recommend to cleanse area with Dial Antibacterial soap or Chlorhexidine twice a day. Pat dry the area, and apply a thin layer of Silvadene over the burn. Then keep area covered with a nonstick bandage.  -Prescribed Silvadene cream for burn. -Discussed about red flags that would prompt an urgent return.  -After the burn heals, you may apply vitamin E oil on the area to help prevent scarring. Also, for this summer apply sunscreen to the area. The skin will be very sensitive.
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
## 2023-10-31 NOTE — Telephone Encounter (Signed)
 Ok to close

## 2023-11-12 DIAGNOSIS — D2239 Melanocytic nevi of other parts of face: Secondary | ICD-10-CM | POA: Diagnosis not present

## 2023-12-03 ENCOUNTER — Other Ambulatory Visit: Payer: Self-pay | Admitting: Obstetrics and Gynecology

## 2023-12-03 DIAGNOSIS — N644 Mastodynia: Secondary | ICD-10-CM

## 2023-12-03 DIAGNOSIS — Z130 Encounter for screening for diseases of the blood and blood-forming organs and certain disorders involving the immune mechanism: Secondary | ICD-10-CM | POA: Diagnosis not present

## 2023-12-03 DIAGNOSIS — Z01419 Encounter for gynecological examination (general) (routine) without abnormal findings: Secondary | ICD-10-CM | POA: Diagnosis not present

## 2023-12-03 DIAGNOSIS — R921 Mammographic calcification found on diagnostic imaging of breast: Secondary | ICD-10-CM

## 2023-12-21 ENCOUNTER — Ambulatory Visit
Admission: RE | Admit: 2023-12-21 | Discharge: 2023-12-21 | Disposition: A | Discharge status: Home / Self Care | Source: Ambulatory Visit | Attending: Obstetrics and Gynecology | Admitting: Obstetrics and Gynecology

## 2023-12-21 ENCOUNTER — Ambulatory Visit

## 2023-12-21 DIAGNOSIS — R921 Mammographic calcification found on diagnostic imaging of breast: Secondary | ICD-10-CM

## 2024-03-23 DIAGNOSIS — J40 Bronchitis, not specified as acute or chronic: Secondary | ICD-10-CM | POA: Diagnosis not present

## 2024-03-23 DIAGNOSIS — R051 Acute cough: Secondary | ICD-10-CM | POA: Diagnosis not present

## 2024-04-15 DIAGNOSIS — N6311 Unspecified lump in the right breast, upper outer quadrant: Secondary | ICD-10-CM | POA: Diagnosis not present

## 2024-04-16 ENCOUNTER — Other Ambulatory Visit: Payer: Self-pay | Admitting: Obstetrics and Gynecology

## 2024-04-16 DIAGNOSIS — N6311 Unspecified lump in the right breast, upper outer quadrant: Secondary | ICD-10-CM

## 2024-04-23 ENCOUNTER — Other Ambulatory Visit: Payer: Self-pay | Admitting: Obstetrics and Gynecology

## 2024-04-23 DIAGNOSIS — R9230 Dense breasts, unspecified: Secondary | ICD-10-CM

## 2024-04-28 ENCOUNTER — Ambulatory Visit
Admission: RE | Admit: 2024-04-28 | Discharge: 2024-04-28 | Disposition: A | Discharge status: Home / Self Care | Source: Ambulatory Visit | Attending: Obstetrics and Gynecology | Admitting: Obstetrics and Gynecology

## 2024-04-28 ENCOUNTER — Ambulatory Visit
Admission: RE | Admit: 2024-04-28 | Discharge: 2024-04-28 | Disposition: A | Source: Ambulatory Visit | Attending: Obstetrics and Gynecology | Admitting: Obstetrics and Gynecology

## 2024-04-28 DIAGNOSIS — N6311 Unspecified lump in the right breast, upper outer quadrant: Secondary | ICD-10-CM

## 2024-04-28 DIAGNOSIS — R928 Other abnormal and inconclusive findings on diagnostic imaging of breast: Secondary | ICD-10-CM | POA: Diagnosis not present

## 2024-04-28 DIAGNOSIS — N6001 Solitary cyst of right breast: Secondary | ICD-10-CM | POA: Diagnosis not present

## 2024-05-27 ENCOUNTER — Ambulatory Visit
Admission: RE | Admit: 2024-05-27 | Discharge: 2024-05-27 | Disposition: A | Discharge status: Home / Self Care | Source: Ambulatory Visit | Attending: Obstetrics and Gynecology | Admitting: Obstetrics and Gynecology

## 2024-05-27 DIAGNOSIS — R9230 Dense breasts, unspecified: Secondary | ICD-10-CM

## 2024-05-27 MED ORDER — GADOPICLENOL 0.5 MMOL/ML IV SOLN
6.0000 mL | Freq: Once | INTRAVENOUS | Status: AC | PRN
  Administered 2024-05-27: 6 mL via INTRAVENOUS

## 2024-05-28 ENCOUNTER — Other Ambulatory Visit: Payer: Self-pay | Admitting: Obstetrics and Gynecology

## 2024-05-28 DIAGNOSIS — R928 Other abnormal and inconclusive findings on diagnostic imaging of breast: Secondary | ICD-10-CM

## 2024-06-09 ENCOUNTER — Ambulatory Visit
Admission: RE | Admit: 2024-06-09 | Discharge: 2024-06-09 | Disposition: A | Discharge status: Home / Self Care | Payer: Self-pay | Source: Ambulatory Visit | Attending: Obstetrics and Gynecology | Admitting: Obstetrics and Gynecology

## 2024-06-09 DIAGNOSIS — D242 Benign neoplasm of left breast: Secondary | ICD-10-CM | POA: Diagnosis not present

## 2024-06-09 DIAGNOSIS — N6012 Diffuse cystic mastopathy of left breast: Secondary | ICD-10-CM | POA: Diagnosis not present

## 2024-06-09 DIAGNOSIS — R928 Other abnormal and inconclusive findings on diagnostic imaging of breast: Secondary | ICD-10-CM

## 2024-06-09 DIAGNOSIS — N6322 Unspecified lump in the left breast, upper inner quadrant: Secondary | ICD-10-CM | POA: Diagnosis not present

## 2024-06-09 MED ORDER — GADOPICLENOL 0.5 MMOL/ML IV SOLN
6.0000 mL | Freq: Once | INTRAVENOUS | Status: AC | PRN
  Administered 2024-06-09: 6 mL via INTRAVENOUS

## 2024-06-10 LAB — SURGICAL PATHOLOGY
